# Patient Record
Sex: Female | Born: 2000 | Race: Black or African American | Hispanic: No | Marital: Single | Smoking: Never smoker
Health system: Southern US, Community
[De-identification: ages and names within clinical notes are randomized; demographics above are authoritative.]

## PROBLEM LIST (undated history)

## (undated) DIAGNOSIS — J302 Other seasonal allergic rhinitis: Secondary | ICD-10-CM

## (undated) DIAGNOSIS — R7303 Prediabetes: Secondary | ICD-10-CM

## (undated) DIAGNOSIS — E119 Type 2 diabetes mellitus without complications: Secondary | ICD-10-CM

## (undated) DIAGNOSIS — F419 Anxiety disorder, unspecified: Secondary | ICD-10-CM

## (undated) DIAGNOSIS — H4710 Unspecified papilledema: Secondary | ICD-10-CM

## (undated) DIAGNOSIS — J45909 Unspecified asthma, uncomplicated: Secondary | ICD-10-CM

## (undated) DIAGNOSIS — F329 Major depressive disorder, single episode, unspecified: Secondary | ICD-10-CM

## (undated) DIAGNOSIS — L309 Dermatitis, unspecified: Secondary | ICD-10-CM

## (undated) DIAGNOSIS — L600 Ingrowing nail: Secondary | ICD-10-CM

## (undated) DIAGNOSIS — F32A Depression, unspecified: Secondary | ICD-10-CM

## (undated) HISTORY — DX: Anxiety disorder, unspecified: F41.9

## (undated) HISTORY — DX: Prediabetes: R73.03

## (undated) HISTORY — PX: WISDOM TOOTH EXTRACTION: SHX21

## (undated) HISTORY — DX: Unspecified papilledema: H47.10

---
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
## 2019-02-07 IMAGING — XA DG FLUORO GUIDE LUMBAR PUNCTURE
2 series · 2 of 2 positions shown · non-contrast
Comparison: none

CLINICAL DATA: Idiopathic intracranial hypertension.

[Series 3: ortho adipose · 1 of 1 slices shown (1 of 2)]
[im 1/1]
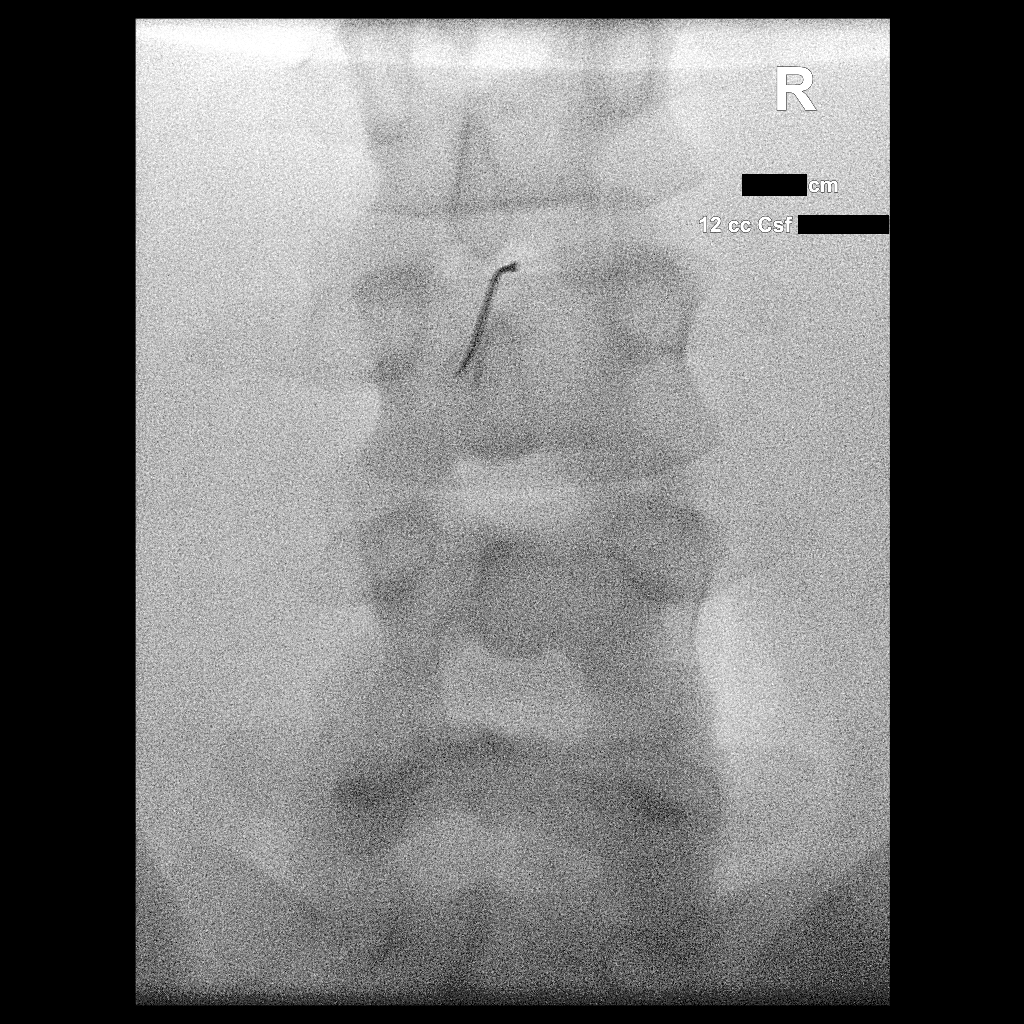

[Series 4: ortho adipose · 1 of 1 slices shown (2 of 2)]
[im 1/1]
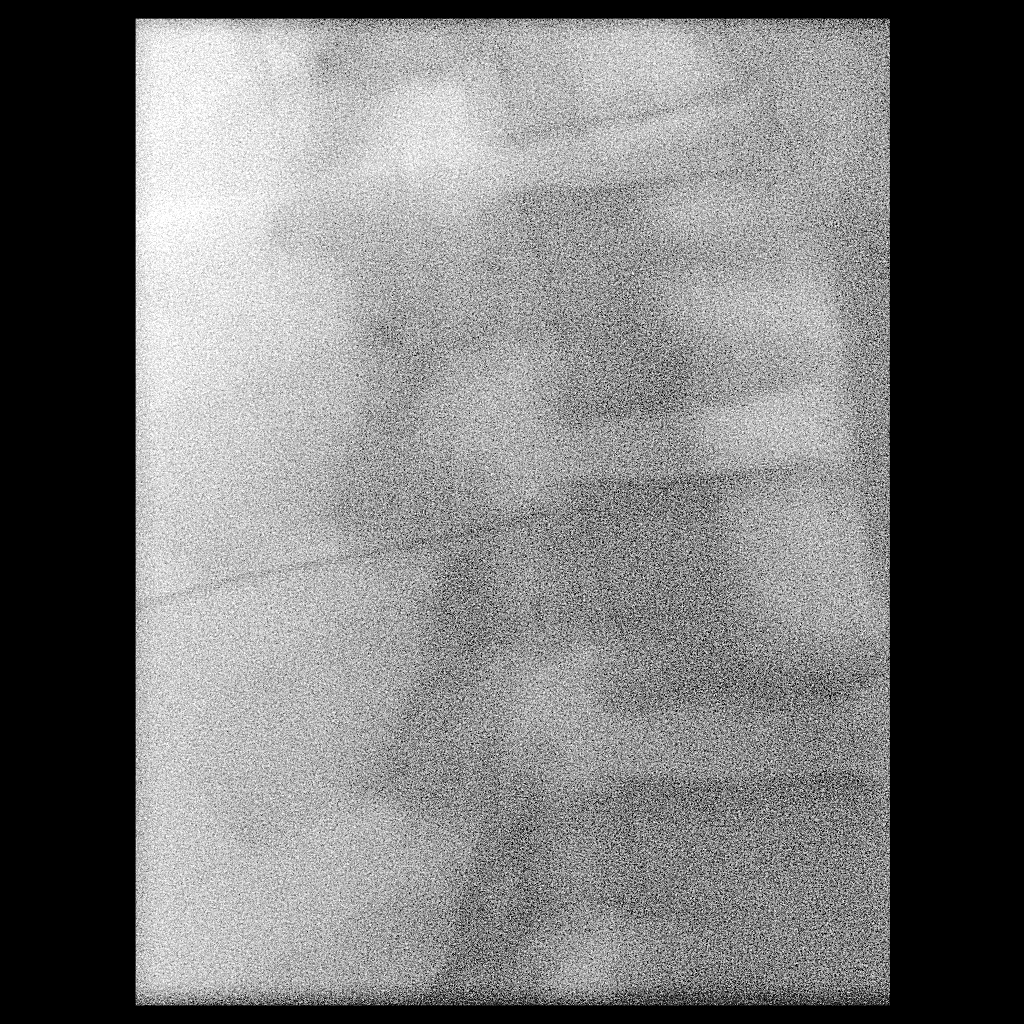

[2 of 2 positions shown; findings below may reference images not displayed]

EXAM:
DIAGNOSTIC LUMBAR PUNCTURE UNDER FLUOROSCOPIC GUIDANCE

FLUOROSCOPY TIME:  Fluoroscopy Time:  1 minutes 49 seconds.

Radiation Exposure Index (if provided by the fluoroscopic device):
990.58 uGy*m2

Number of Acquired Spot Images: 0

PROCEDURE:
Informed consent was obtained from the patient prior to the
procedure, including potential complications of headache, allergy,
and pain. With the patient prone, the lower back was prepped with
Betadine. 1% Lidocaine was used for local anesthesia. Lumbar
puncture was performed at the right paramedian L3-4 level using a 6
inch 20 gauge needle with return of clear CSF with an opening
pressure of 30 cm water. Of note, the opening pressure was obtained
with the patient in the prone position. 15 cm water was measured in
the manometer. The needle was an additional 15 cm in length. I was
unable to access the subarachnoid space in the left lateral
decubitus position. The patient was unable to roll into the left
lateral decubitus position after needle placement. She was extremely
anxious throughout the procedure. 12.0 ml of CSF were obtained for
laboratory studies. Apart from extreme anxiety, the patient
tolerated the procedure well and there were no apparent
complications.
IMPRESSION: 1. Technically successful fluoroscopic guided lumbar puncture via a
right paramedian approach at the L3-4 level.
2. Elevated opening pressure compatible with the presumed diagnosis
of idiopathic intracranial hypertension. The opening pressure was 30
cm water, acquired in the prone position.
3. 12.0 mL clear CSF was sent for laboratory testing.

## 2019-02-20 ENCOUNTER — Telehealth: Payer: Self-pay | Admitting: *Deleted

## 2019-02-20 ENCOUNTER — Ambulatory Visit: Payer: Medicaid Other | Admitting: Neurology

## 2019-02-20 ENCOUNTER — Encounter: Payer: Self-pay | Admitting: Neurology

## 2019-02-20 NOTE — Telephone Encounter (Signed)
No showed follow up appointment. 

## 2019-04-17 ENCOUNTER — Ambulatory Visit: Payer: Medicaid Other | Admitting: Neurology

## 2019-05-29 ENCOUNTER — Ambulatory Visit: Payer: Medicaid Other | Admitting: Neurology

## 2019-07-09 ENCOUNTER — Telehealth: Payer: Self-pay | Admitting: *Deleted

## 2019-07-09 ENCOUNTER — Ambulatory Visit: Payer: Medicaid Other | Admitting: Neurology

## 2019-07-09 ENCOUNTER — Encounter: Payer: Self-pay | Admitting: Neurology

## 2019-07-09 NOTE — Telephone Encounter (Signed)
Canceled follow up same day.  No reason was obtained.

## 2020-01-31 ENCOUNTER — Encounter (INDEPENDENT_AMBULATORY_CARE_PROVIDER_SITE_OTHER): Payer: Self-pay | Admitting: Family

## 2020-01-31 ENCOUNTER — Other Ambulatory Visit: Payer: Self-pay

## 2020-01-31 ENCOUNTER — Ambulatory Visit (INDEPENDENT_AMBULATORY_CARE_PROVIDER_SITE_OTHER): Payer: Medicaid Other | Admitting: Family

## 2020-01-31 VITALS — BP 128/68 | HR 78 | Wt 380.0 lb

## 2020-01-31 DIAGNOSIS — R7303 Prediabetes: Secondary | ICD-10-CM

## 2020-01-31 DIAGNOSIS — L680 Hirsutism: Secondary | ICD-10-CM | POA: Diagnosis not present

## 2020-01-31 DIAGNOSIS — L83 Acanthosis nigricans: Secondary | ICD-10-CM | POA: Diagnosis not present

## 2020-01-31 DIAGNOSIS — Z68.41 Body mass index (BMI) pediatric, greater than or equal to 95th percentile for age: Secondary | ICD-10-CM

## 2020-01-31 LAB — POCT GLYCOSYLATED HEMOGLOBIN (HGB A1C): Hemoglobin A1C: 5.8 % — AB (ref 4.0–5.6)

## 2020-01-31 LAB — POCT GLUCOSE (DEVICE FOR HOME USE): POC Glucose: 95 mg/dl (ref 70–99)

## 2020-01-31 MED ORDER — METFORMIN HCL 500 MG PO TABS: 500.0000 mg | ORAL_TABLET | Freq: Two times a day (BID) | ORAL | 5 refills | Status: DC

## 2020-01-31 NOTE — Progress Notes (Signed)
Subjective:  Subjective  Patient Name: Pamela Bowman Date of Birth: 06-18-2001  MRN: [id number]  Pamela Bowman  presents to the office today for follow up evaluation and management of her morbid obesity and prediabetes  HISTORY OF PRESENT ILLNESS:   Pamela Bowman is a 19 y.o. AA [employer]   Pamela Bowman was accompanied by her self   1. Pamela Bowman was seen by her PCP in August 2016 for her 14 year WCC. At that visit they discussed rapid weight gain and elevated acanthosis and elevated a1c. She was started on Metformin and lifestyle changes with more frequent follow up. She had some initial weight loss but then regained her weight. She was referred to endocrinology for further evaluation and management.    2. Pamela Bowman's last clinic visit was 08/2017. Pamela Bowman has been generally healthy.   She is currently working at a [employer] and plans to go back to [employer].   She has started being seen by neurology for headaches. She had a spinal tap and CT but reports that everything was normal.   She is taking 500 mg of Metformin BID, he PCP has been refilling it. She reports some upset stomach with medication.   She got a treadmill but has not been exercising very often. She is hoping to start doing 5 minutes about 3 x per week.   She has cut back on sugar drinks, likes sparkling water now. Rarely going out to eat. Gets second servings at meals but is trying to cut back.    3. Pertinent Review of Systems:  All systems reviewed with pertinent positives listed below; otherwise negative. Constitutional: 45 lbs weight gain since last visit.  Sleeping well HEENT: No vision changes. No difficulty swallowing.  Respiratory: No increased work of breathing currently GI: No constipation or diarrhea Musculoskeletal: No joint deformity Neuro: Normal affect. No tremors. + headaches, followed by neurology.  Endocrine: As above   PAST MEDICAL, FAMILY, AND SOCIAL HISTORY  Past Medical History:  Diagnosis Date  . Asthma   . Depression    . Diabetes mellitus without complication (HCC)    says it is pre-diabetes  . Eczema   . Ingrown toenail   . Optic nerve edema   . Seasonal allergies     Family History  Problem Relation Age of Onset  . Diabetes Other   . Hypertension Other   . Breast cancer Maternal Grandmother   . Obesity Maternal Grandmother   . Cancer Paternal Grandfather        unsure of type  . Asthma Paternal Aunt   . Obesity Paternal Grandmother      Current Outpatient Medications:  .  albuterol (PROVENTIL HFA;VENTOLIN HFA) 108 (90 Base) MCG/ACT inhaler, Inhale 1-2 puffs into the lungs every 6 (six) hours as needed for wheezing or shortness of breath., Disp: 1 Inhaler, Rfl: 0 .  buPROPion (WELLBUTRIN SR) 200 MG 12 hr tablet, Take 200 mg by mouth daily., Disp: , Rfl: 0 .  cyclobenzaprine (FLEXERIL) 5 MG tablet, Take 1 tablet (5 mg total) by mouth 3 (three) times daily as needed for muscle spasms., Disp: 60 tablet, Rfl: 1 .  metFORMIN (GLUCOPHAGE) 500 MG tablet, Take 1 tablet (500 mg total) by mouth 2 (two) times daily with a meal., Disp: 60 tablet, Rfl: 5 .  naproxen (NAPROSYN) 500 MG tablet, Take 1 tablet (500 mg total) by mouth 2 (two) times daily as needed., Disp: 60 tablet, Rfl: 0 .  nortriptyline (PAMELOR) 25 MG capsule, Take 2 capsules (50 mg  total) by mouth at bedtime., Disp: 60 capsule, Rfl: 11 .  ondansetron (ZOFRAN ODT) 4 MG disintegrating tablet, Take 1 tablet (4 mg total) by mouth every 8 (eight) hours as needed., Disp: 20 tablet, Rfl: 11 .  prazosin (MINIPRESS) 1 MG capsule, TAKE 1 CAPSULE BY MOUTH EVERYDAY AT BEDTIME, Disp: , Rfl: 3 .  rizatriptan (MAXALT-MLT) 10 MG disintegrating tablet, Take 1 tablet (10 mg total) by mouth as needed. May repeat in 2 hours if needed, no more than 2 tabs in 24 hours  Do not refill in less than 30 days,, Disp: 12 tablet, Rfl: 11 .  Spacer/Aero-Holding Chambers (AEROCHAMBER PLUS) inhaler, Use as instructed, Disp: 1 each, Rfl: 2 .  topiramate (TOPAMAX) 100 MG  tablet, Take 2 tablets (200 mg total) by mouth at bedtime., Disp: 90 tablet, Rfl: 4 .  triamcinolone cream (KENALOG) 0.1 %, Apply 1 application topically 2 (two) times daily., Disp: 30 g, Rfl: 0 .  cetirizine (ZYRTEC) 10 MG tablet, Take 10 mg by mouth daily. , Disp: , Rfl:  .  FLOVENT HFA 110 MCG/ACT inhaler, INHALE 2 PUFFS (220 MCG) BY INHALATION ROUTE 2 TIMES PER DAY FOR 30 DAYS (Patient not taking: Reported on 01/31/2020), Disp: , Rfl: 11  Allergies as of 01/31/2020  . (No Known Allergies)     reports that she has never smoked. She has never used smokeless tobacco. She reports that she does not drink alcohol and does not use drugs. Pediatric History  Patient Parents  . Flax,Damien (Father)   Other Topics Concern  . Not on file  Social History Narrative   12th grade at [employer].   Right-handed.   Lives at home with father and grandmother.   Some day use of caffeine.   1. [employer] and Family: lives with aunt and grandmother. Brothers live with mom.  2. Activities: not active.  Walking with family. Climbing stairs at [employer].  3. Primary Care Provider: [facility]  ROS: There are no other significant problems involving Pamela Bowman other body systems.    Objective:  Objective  Vital Signs:  BP 128/68   Pulse 78   Wt (!) 380 lb (172.4 kg)   LMP 01/01/2020   BMI 71.80 kg/m   Growth percentile SmartLinks can only be used for patients less than 8 years old.  Ht Readings from Last 3 Encounters:  03/02/18 5\' 1"  (1.549 m) (10 %, Z= -1.27)*  03/01/18 5\' 1"  (1.549 m) (10 %, Z= -1.27)*  11/03/17 5\' 1"  (1.549 m) (10 %, Z= -1.26)*   * Growth percentiles are based on CDC (Girls, 2-20 Years) data.   Wt Readings from Last 3 Encounters:  01/31/20 (!) 380 lb (172.4 kg)  03/02/18 (!) 335 lb (152 kg) (>99 %, Z= 2.86)*  03/01/18 (!) 329 lb (149.2 kg) (>99 %, Z= 2.84)*   * Growth percentiles are based on CDC (Girls, 2-20 Years) data.   HC Readings from  Last 3 Encounters:  No data found for Pamela Bowman   Body surface area is 2.72 meters squared. Facility age limit for growth percentiles is 20 years. Facility age limit for growth percentiles is 20 years.    PHYSICAL EXAM:  General: Obese [place] in no acute distress.  Head: Normocephalic, atraumatic.   Eyes:  Pupils equal and round. EOMI.   Sclera white.  No eye drainage.   Ears/Nose/Mouth/Throat: Nares patent, no nasal drainage.  Normal dentition, mucous membranes moist.   Neck: supple, no cervical lymphadenopathy, no  thyromegaly Cardiovascular: regular rate, normal S1/S2, no murmurs Respiratory: No increased work of breathing.  Lungs clear to auscultation bilaterally.  No wheezes. Abdomen: soft, nontender, nondistended. Normal bowel sounds.  No appreciable masses  Extremities: warm, well perfused, cap refill < 2 sec.   Musculoskeletal: Normal muscle mass.  Normal strength Skin: warm, dry.  No rash or lesions. + acanthosis nigricans.  Neurologic: alert and oriented, normal speech, no tremor   LAB DATA:   Results for orders placed or performed in visit on 01/31/20  POCT Glucose (Device for Home Use)  Result Value Ref Range   Glucose Fasting, POC     POC Glucose 95 70 - 99 mg/dl  POCT glycosylated hemoglobin (Hb A1C)  Result Value Ref Range   Hemoglobin A1C 5.8 (A) 4.0 - 5.6 %   HbA1c POC (<> result, manual entry)     HbA1c, POC (prediabetic range)     HbA1c, POC (controlled diabetic range)          LDL 149 in November 2016  Assessment and Plan:  Assessment  ASSESSMENT:  Pamela Bowman is a 19 y.o. AA [employer] with prediabetes and morbid pediatric obesity. Pamela [place] has struggled with lifestyle changes. She has gained 45 lbs, BMI is >99%ile. Hemoglobin A1c is prediabetes range at 5.8% on 500 mg of Metformin BID. She is going to transition to adult endocrinology care.    1. Mobid obesity/prediabetes/acanthosis -POCT Glucose (CBG) and POCT HgB A1C obtained today -Growth chart reviewed with  family -Discussed pathophysiology of T2DM and explained hemoglobin A1c levels -Discussed eliminating sugary beverages, changing to occasional diet sodas, and increasing water intake -Encouraged to eat most meals at home -Encouraged to increase physical activity - 500 mg of Metformin BID   - Refills ordered   2. Hirsutism  - Discussed options for decreasing hair  - 1. OCP  - 2. Spironolactone --> will make hair thinner and lighter but takes 2-3 months to work  - She prefers to wait at this time.   3. Hyperlipidemia  - Discussed importance of dietary changes and exercise.  - Fasting lipid panel ordered   Follow up : NO follow up. She will be referred to Adult endocrinology.    >30  spent today reviewing the medical chart, counseling the patient/family, and documenting today's visit.    Gretchen Short,  FNP-C  Pediatric Specialist  [address]  [place], [address]  Tele: [phone]

## 2020-01-31 NOTE — Patient Instructions (Signed)
-  Eliminate sugary drinks (regular soda, juice, sweet tea, regular gatorade) from your diet -Drink water or milk (preferably 1% or skim) -Avoid fried foods and junk food (chips, cookies, candy) -Watch portion sizes -Pack your lunch for school -Try to get 30 minutes of activity daily  - Will refer to adult endocrinology to establish care  - Metformin 500 mg twice per day

## 2020-02-19 ENCOUNTER — Ambulatory Visit: Payer: Medicaid Other | Admitting: Neurology

## 2020-03-03 ENCOUNTER — Telehealth (INDEPENDENT_AMBULATORY_CARE_PROVIDER_SITE_OTHER): Payer: Self-pay | Admitting: Family

## 2020-03-03 NOTE — Telephone Encounter (Signed)
That is fine 

## 2020-03-03 NOTE — Telephone Encounter (Signed)
Called patient back, grandma put diabetes on the original DMV forms while patient was in hospital.  Pamela [place] filled out the [facility] forms at her last appointment.  She mailed the forms in and they have not gotten them yet.  She said they need a letter on letter head with her A1C and that she is compliant with her treatment.  (similar to what is requested on page 4 of the DMV form but on letterhead)

## 2020-03-03 NOTE — Telephone Encounter (Signed)
  Who's calling (name and relationship to patient) : Ghina, Bittinger Best contact number: [phone] Provider they see: Ovidio Kin Reason for call: [place] is having a hard time getting her DMV forms submitted with all the information that is needed in the proper format.  Please call.     PRESCRIPTION REFILL ONLY  Name of prescription:  Pharmacy:

## 2020-03-03 NOTE — Telephone Encounter (Signed)
Returned phone call to patient.  She called DMV this am to follow up on the paper that [place] completed.  They need a "letter with her A1C and compliance."  She said that all this is on the paper but they also need a letter.  They need it faxed to [phone]

## 2020-03-04 ENCOUNTER — Encounter (INDEPENDENT_AMBULATORY_CARE_PROVIDER_SITE_OTHER): Payer: Self-pay

## 2020-03-06 NOTE — Telephone Encounter (Signed)
[employer] called to check on this request

## 2020-03-06 NOTE — Telephone Encounter (Signed)
Called to let her know letter was written and faxed.  She would like a copy mailed to her.  She mentioned she will call DMV to see if they received the letter.

## 2020-03-24 ENCOUNTER — Ambulatory Visit: Payer: Medicaid Other | Admitting: Neurology

## 2020-05-06 ENCOUNTER — Ambulatory Visit: Payer: Self-pay | Admitting: Endocrinology

## 2020-05-07 ENCOUNTER — Ambulatory Visit: Payer: Medicaid Other | Admitting: Neurology

## 2020-06-06 ENCOUNTER — Ambulatory Visit: Payer: Self-pay | Admitting: Endocrinology

## 2020-06-25 ENCOUNTER — Telehealth: Payer: Self-pay | Admitting: Neurology

## 2020-06-25 ENCOUNTER — Other Ambulatory Visit: Payer: Self-pay

## 2020-06-25 ENCOUNTER — Ambulatory Visit: Payer: Medicaid Other | Admitting: Neurology

## 2020-06-25 ENCOUNTER — Encounter: Payer: Self-pay | Admitting: Neurology

## 2020-06-25 VITALS — BP 134/77 | HR 94 | Ht 61.0 in | Wt 383.0 lb

## 2020-06-25 DIAGNOSIS — G932 Benign intracranial hypertension: Secondary | ICD-10-CM | POA: Diagnosis not present

## 2020-06-25 DIAGNOSIS — G43709 Chronic migraine without aura, not intractable, without status migrainosus: Secondary | ICD-10-CM

## 2020-06-25 HISTORY — DX: Chronic migraine without aura, not intractable, without status migrainosus: G43.709

## 2020-06-25 MED ORDER — ALBUTEROL SULFATE HFA 108 (90 BASE) MCG/ACT IN AERS: 1.0000 | INHALATION_SPRAY | Freq: Four times a day (QID) | RESPIRATORY_TRACT | 6 refills | Status: DC | PRN

## 2020-06-25 MED ORDER — TOPIRAMATE 100 MG PO TABS: 200.0000 mg | ORAL_TABLET | Freq: Every day | ORAL | 4 refills | Status: DC

## 2020-06-25 MED ORDER — RIZATRIPTAN BENZOATE 10 MG PO TBDP
10.0000 mg | ORAL_TABLET | ORAL | 11 refills | Status: DC | PRN
Start: 2020-06-25 — End: 2021-06-24

## 2020-06-25 NOTE — Progress Notes (Signed)
Chief Complaint  Patient presents with  . Pseudotumor Cerebri    Reports improvement in headaches since being on Topamax 100mg , 2 tabs QHS. She has some tingling around her mouth but it is tolerable. Maxalt works well for her rescue medication.     HISTORICAL Arienna Benegas 19 years old female, seen in refer by her primary care physician Dr. 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 Initial evaluation was on Aug 29 2017.  She is accompanied by her grandmother  During her most recent yearly checkup by Dr. 08-15-1972 on August 20, 2017, there was noted bilateral disc margin is indistinct, this is crowded, she denies significant headaches, no loss of vision, but had transient blurry vision occasionally.  She is referred here for possible pseudotumor cerebri, she does have a history of obesity, but denies significant weight change over the past few weeks.  She noted left upper and lower eyelid swelling since August 26, 2017 gradually getting worse, to the point of walking left vision sometimes,  She was given 2 rounds of double strength Bactrim since August 04, 2017 for cellulitis   Lab: A1C 5.5, BMP, CBC,  MRI of the orbit without contrast showed no significant abnormality in March 2019 Left eyelid cellulitis is much improved with antibiotic treatment, She has 2-3 headaches each week, lateralized, pounding headache with associated light noise sensitivity, she has been taking frequent BC powders, she has been absent from school for many days, complains of blurred vision, especially with sudden positional change.  Lumbar puncture on September 29, 2017 showed opening pressure of 30 cm water,  Normal CSF, WBC of 4, RBC of 0, protein of 32,  Her headache overall has  much improved, She is tolerating Topamax 100 mg twice a day, complains of numbness, tingly,  loss of appetite, now she has headache 2-3 times each week, Maxalt 5 mg as needed initially works well, now is  not as effective  Reviewed feedback from ophthalmologist Dr. 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 10-30-1993 recently, most recent visit in July 2021, there was no significant abnormality noted  She denies visual change, migraine has improved, about twice a week, responding well to Maxalt,   REVIEW OF SYSTEMS: Full 14 system review of systems performed and notable only for as above All other review of systems were negative.  ALLERGIES: No Known Allergies  HOME MEDICATIONS: Current Outpatient Medications  Medication Sig Dispense Refill  . albuterol (PROVENTIL HFA;VENTOLIN HFA) 108 (90 Base) MCG/ACT inhaler Inhale 1-2 puffs into the lungs every 6 (six) hours as needed for wheezing or shortness of breath. 1 Inhaler 0  . buPROPion (WELLBUTRIN SR) 200 MG 12 hr tablet Take 200 mg by mouth daily.  0  . cetirizine (ZYRTEC) 10 MG tablet Take 10 mg by mouth daily.    . cyclobenzaprine (FLEXERIL) 5 MG tablet Take 1 tablet (5 mg total) by mouth 3 (three) times daily as needed for muscle spasms. 60 tablet 1  . FLOVENT HFA 110 MCG/ACT inhaler INHALE 2 PUFFS (220 MCG) BY INHALATION ROUTE 2 TIMES PER DAY FOR 30 DAYS  11  . metFORMIN (GLUCOPHAGE) 500 MG tablet Take 1 tablet (  500 mg total) by mouth 2 (two) times daily with a meal. 60 tablet 5  . naproxen (NAPROSYN) 500 MG tablet Take 1 tablet (500 mg total) by mouth 2 (two) times daily as needed. 60 tablet 0  . nortriptyline (PAMELOR) 25 MG capsule Take 2 capsules (50 mg total) by mouth at bedtime. 60 capsule 11  . ondansetron (ZOFRAN ODT) 4 MG disintegrating tablet Take 1 tablet (4 mg total) by mouth  every 8 (eight) hours as needed. 20 tablet 11  . prazosin (MINIPRESS) 1 MG capsule TAKE 1 CAPSULE BY MOUTH EVERYDAY AT BEDTIME  3  . rizatriptan (MAXALT-MLT) 10 MG disintegrating tablet Take 1 tablet (10 mg total) by mouth as needed. May repeat in 2 hours if needed, no more than 2 tabs in 24 hours  Do not refill in less than 30 days, 12 tablet 11  . Spacer/Aero-Holding Chambers (AEROCHAMBER PLUS) inhaler Use as instructed 1 each 2  . topiramate (TOPAMAX) 100 MG tablet Take 2 tablets (200 mg total) by mouth at bedtime. 90 tablet 4  . triamcinolone cream (KENALOG) 0.1 % Apply 1 application topically 2 (two) times daily. 30 g 0   No current facility-administered medications for this visit.    PAST MEDICAL HISTORY: Past Medical History:  Diagnosis Date  . Asthma   . Depression   . Diabetes mellitus without complication (HCC)    says it is pre-diabetes  . Eczema   . Ingrown toenail   . Optic nerve edema   . Seasonal allergies     PAST SURGICAL HISTORY: Past Surgical History:  Procedure Laterality Date  . TONSILLECTOMY AND ADENOIDECTOMY Bilateral 10/15/2015   Procedure: TONSILLECTOMY AND ADENOIDECTOMY;  Surgeon: Newman Pies, MD;  Location: [facility] OR;  Service: ENT;  Laterality: Bilateral;  . WISDOM TOOTH EXTRACTION      FAMILY HISTORY: Family History  Problem Relation Age of Onset  . Diabetes Other   . Hypertension Other   . Breast cancer Maternal Grandmother   . Obesity Maternal Grandmother   . Cancer Paternal Grandfather        unsure of type  . Asthma Paternal Aunt   . Obesity Paternal Grandmother     SOCIAL HISTORY: Social History   Socioeconomic History  . Marital status: Single    Spouse name: Not on file  . Number of children: 0  . Years of education: high school  . Highest education level: Not on file  Occupational History  . Occupation: Consulting civil engineer  Tobacco Use  . Smoking status: Never Smoker  . Smokeless tobacco: Never Used  Vaping Use  . Vaping Use: Never used   Substance and Sexual Activity  . Alcohol use: No  . Drug use: No  . Sexual activity: Not on file  Other Topics Concern  . Not on file  Social History Narrative   12th grade at [employer].   Right-handed.   Lives at home with father and grandmother.   Some day use of caffeine.   Social Determinants of Health   Financial Resource Strain: Not on file  Food Insecurity: Not on file  Transportation Needs: Not on file  Physical Activity: Not on file  Stress: Not on file  Social Connections: Not on file  Intimate Partner Violence: Not on file     PHYSICAL EXAM   Vitals:   06/25/20 0730  BP: 134/77  Pulse: 94  Weight: (!) 383 lb (173.7 kg)  Height: 5\' 1"  (1.549 m)   Not recorded  Body mass index is 72.37 kg/m.  PHYSICAL EXAMNIATION:  Gen: NAD, conversant, well nourised, well groomed                     Cardiovascular: Regular rate rhythm, no peripheral edema, warm, nontender. Eyes: Conjunctivae clear without exudates or hemorrhage Neck: Supple, no carotid bruits. Pulmonary: Clear to auscultation bilaterally   NEUROLOGICAL EXAM:  MENTAL STATUS: Speech/cognition: Awake alert oriented to history taking care of conversation   CRANIAL NERVES: CN II: Visual fields are full to confrontation. Pupils are round equal and briskly reactive to light. CN III, IV, VI: extraocular movement are normal. No ptosis. CN V: Facial sensation is intact to light touch CN VII: Face is symmetric with normal eye closure  CN VIII: Hearing is normal to causal conversation. CN IX, X: Phonation is normal. CN XI: Head turning and shoulder shrug are intact  MOTOR: There is no pronator drift of out-stretched arms. Muscle bulk and tone are normal. Muscle strength is normal.  REFLEXES: Reflexes are 2+ and symmetric at the biceps, triceps, knees, and ankles. Plantar responses are flexor.  SENSORY: Intact to light touch, pinprick and vibratory sensation are intact in fingers and  toes.  COORDINATION: There is no trunk or limb dysmetria noted.  GAIT/STANCE: Limited by her big body habitus   DIAGNOSTIC DATA (LABS, IMAGING, TESTING) - I reviewed patient records, labs, notes, testing and imaging myself where available.   ASSESSMENT AND PLAN  Soniya Ashraf is a 19 y.o. female   Morbid obesity Pseudotumor Cerebri Chronic migraine headache  Refill Topamax 100 mg 2 tablets every night  Emphasized importance of weight loss, continue to work with her endocrinologist  Maxalt works well, refilled the prescription  Continue follow-up with ophthalmologist Dr. Dione Booze, will get most recent record, if there is persistent optic disc edema, may consider repeat lumbar puncture,  Return to clinic with Sarah in 6 months  Levert Feinstein, M.D. Ph.D.  [facility] [address], [address] [place], [place] [address] Ph: [phone] Fax: [phone]  CC:  [facility] [address] [place],  [place] [address]  Patient, No Pcp Per

## 2020-06-25 NOTE — Telephone Encounter (Signed)
Please get medical record from her most recent visit with ophthalmologist Dr. Dione Booze.

## 2020-07-10 ENCOUNTER — Ambulatory Visit: Payer: Self-pay | Admitting: Endocrinology

## 2020-08-18 ENCOUNTER — Ambulatory Visit: Payer: Self-pay | Admitting: Endocrinology

## 2020-08-19 ENCOUNTER — Other Ambulatory Visit (INDEPENDENT_AMBULATORY_CARE_PROVIDER_SITE_OTHER): Payer: Self-pay | Admitting: Family

## 2020-08-25 ENCOUNTER — Ambulatory Visit: Payer: Medicaid Other | Admitting: Family Medicine

## 2020-09-10 ENCOUNTER — Ambulatory Visit: Payer: Medicaid Other | Admitting: Family Medicine

## 2020-09-25 ENCOUNTER — Ambulatory Visit: Payer: Self-pay | Admitting: Endocrinology

## 2020-12-04 ENCOUNTER — Other Ambulatory Visit (INDEPENDENT_AMBULATORY_CARE_PROVIDER_SITE_OTHER): Payer: Self-pay | Admitting: Family

## 2020-12-24 ENCOUNTER — Ambulatory Visit: Payer: Medicaid Other | Admitting: Neurology

## 2020-12-29 ENCOUNTER — Other Ambulatory Visit: Payer: Self-pay | Admitting: Neurology

## 2021-06-23 NOTE — Progress Notes (Signed)
PATIENT: Pamela Bowman DOB: [birth year]  REASON FOR VISIT: Follow up HISTORY FROM: Patient PRIMARY NEUROLOGIST: Dr. Terrace Arabia   HISTORY  Pamela Bowman 20 years old female, seen in refer by her primary care physician Dr. 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 Initial evaluation was on Aug 29 2017.  She is accompanied by her grandmother   During her most recent yearly checkup by Dr. Donald Prose on August 20, 2017, there was noted bilateral disc margin is indistinct, this is crowded, she denies significant headaches, no loss of vision, but had transient blurry vision occasionally.  She is referred here for possible pseudotumor cerebri, she does have a history of obesity, but denies significant weight change over the past few weeks.   She noted left upper and lower eyelid swelling since August 26, 2017 gradually getting worse, to the point of walking left vision sometimes,   She was given 2 rounds of double strength Bactrim since August 04, 2017 for cellulitis    Lab: A1C 5.5, BMP, CBC,   MRI of the orbit without contrast showed no significant abnormality in March 2019 Left eyelid cellulitis is much improved with antibiotic treatment, She has 2-3 headaches each week, lateralized, pounding headache with associated light noise sensitivity, she has been taking frequent BC powders, she has been absent from school for many days, complains of blurred vision, especially with sudden positional change.   Lumbar puncture on September 29, 2017 showed opening pressure of 30 cm water,   Normal CSF, WBC of 4, RBC of 0, protein of 32,   Her headache overall has  much improved, She is tolerating Topamax 100 mg twice a day, complains of numbness, tingly,  loss of appetite, now she has headache 2-3 times each week, Maxalt 5 mg as needed initially works well, now is not as effective   Reviewed feedback from ophthalmologist Dr. 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 Dione Booze recently, most recent visit in July 2021, there was no significant abnormality noted   She denies visual change, migraine has improved, about twice a week, responding well to Maxalt  Update June 24, 2021 SS: Here today alone, has gained 5 lbs, now weighs 187 lbs. She thought she lost weight, going up steps, cooking at home, stopping drink soda. Still on topamax 200 mg at bedtime. Seeing Dr. Dione Booze in March. Saw him twice last year, end of year in 2021 December. Has drivers license now. Migraines are essentially resolved, maybe 1/month, will take extra strength Tylenol, it helps. Maxalt makes her feel tingly, anxious. No vision disturbances, needs to get new frames. Needs new PCP.   REVIEW OF SYSTEMS: Out of a complete 14 system review of symptoms, the patient complains only of the following symptoms, and all other reviewed systems are negative.   See HPI  ALLERGIES: No Known Allergies  HOME MEDICATIONS: Outpatient Medications Prior to Visit  Medication Sig Dispense Refill   buPROPion (WELLBUTRIN SR) 200 MG 12 hr tablet Take 200 mg by mouth daily.  0   cetirizine (ZYRTEC) 10 MG tablet Take 10 mg by mouth daily.     cyclobenzaprine (FLEXERIL) 5 MG tablet Take 1  tablet (5 mg total) by mouth 3 (three) times daily as needed for muscle spasms. 60 tablet 1   FLOVENT HFA 110 MCG/ACT inhaler INHALE 2 PUFFS (220 MCG) BY INHALATION ROUTE 2 TIMES PER DAY FOR 30 DAYS  11   metFORMIN (GLUCOPHAGE) 500 MG tablet Take 1 tablet (500 mg total) by mouth 2 (two) times daily with a meal. 60 tablet 5   naproxen (NAPROSYN) 500 MG tablet Take 1 tablet (500 mg total) by  mouth 2 (two) times daily as needed. 60 tablet 0   ondansetron (ZOFRAN ODT) 4 MG disintegrating tablet Take 1 tablet (4 mg total) by mouth every 8 (eight) hours as needed. 20 tablet 11   prazosin (MINIPRESS) 1 MG capsule TAKE 1 CAPSULE BY MOUTH EVERYDAY AT BEDTIME  3   Spacer/Aero-Holding Chambers (AEROCHAMBER PLUS) inhaler Use as instructed 1 each 2   triamcinolone cream (KENALOG) 0.1 % Apply 1 application topically 2 (two) times daily. 30 g 0   albuterol (VENTOLIN HFA) 108 (90 Base) MCG/ACT inhaler Inhale 1-2 puffs into the lungs every 6 (six) hours as needed for wheezing or shortness of breath. 1 each 6   nortriptyline (PAMELOR) 25 MG capsule Take 2 capsules (50 mg total) by mouth at bedtime. 60 capsule 11   rizatriptan (MAXALT-MLT) 10 MG disintegrating tablet Take 1 tablet (10 mg total) by mouth as needed. May repeat in 2 hours if needed, no more than 2 tabs in 24 hours  Do not refill in less than 30 days, 12 tablet 11   topiramate (TOPAMAX) 100 MG tablet Take 2 tablets (200 mg total) by mouth at bedtime. 180 tablet 4   No facility-administered medications prior to visit.    PAST MEDICAL HISTORY: Past Medical History:  Diagnosis Date   Asthma    Depression    Diabetes mellitus without complication (HCC)    says it is pre-diabetes   Eczema    Ingrown toenail    Optic nerve edema    Seasonal allergies     PAST SURGICAL HISTORY: Past Surgical History:  Procedure Laterality Date   TONSILLECTOMY AND ADENOIDECTOMY Bilateral 10/15/2015   Procedure: TONSILLECTOMY AND ADENOIDECTOMY;  Surgeon: Newman Pies, MD;  Location: [facility] OR;  Service: ENT;  Laterality: Bilateral;   WISDOM TOOTH EXTRACTION      FAMILY HISTORY: Family History  Problem Relation Age of Onset   Diabetes Other    Hypertension Other    Breast cancer Maternal Grandmother    Obesity Maternal Grandmother    Cancer Paternal Grandfather        unsure of type   Asthma Paternal Aunt    Obesity Paternal Grandmother      SOCIAL HISTORY: Social History   Socioeconomic History   Marital status: Single    Spouse name: Not on file   Number of children: 0   Years of education: high school   Highest education level: Not on file  Occupational History   Occupation: Consulting civil engineer  Tobacco Use   Smoking status: Never   Smokeless tobacco: Never  Vaping Use   Vaping Use: Never used  Substance and Sexual Activity   Alcohol use: No   Drug use: No   Sexual activity: Not on file  Other Topics Concern   Not on file  Social History Narrative   12th grade at [employer].   Right-handed.   Lives at home with father and grandmother.   Some day use of caffeine.   Social Determinants of Health  Financial Resource Strain: Not on file  Food Insecurity: Not on file  Transportation Needs: Not on file  Physical Activity: Not on file  Stress: Not on file  Social Connections: Not on file  Intimate Partner Violence: Not on file   PHYSICAL EXAM  Vitals:   06/24/21 0751  BP: (!) 160/90  Pulse: (!) 101  Weight: (!) 387 lb (175.5 kg)  Height: 5\' 1"  (1.549 m)   Body mass index is 73.12 kg/m.  Generalized: Well developed, in no acute distress  Neurological examination  Mentation: Alert oriented to time, place, history taking. Follows all commands speech and language fluent Cranial nerve II-XII: Pupils were equal round reactive to light. Extraocular movements were full, visual field were full on confrontational test. Facial sensation and strength were normal. Uvula tongue midline. Head turning and shoulder shrug  were normal and symmetric. Motor: The motor testing reveals 5 over 5 strength of all 4 extremities. Good symmetric motor tone is noted throughout.  Sensory: Sensory testing is intact to soft touch on all 4 extremities. No evidence of extinction is noted.  Coordination: Cerebellar testing reveals good finger-nose-finger and heel-to-shin bilaterally.  Gait and station: Gait is normal, but limited by  large body habitus  Reflexes: Deep tendon reflexes are symmetric and normal bilaterally.   DIAGNOSTIC DATA (LABS, IMAGING, TESTING) - I reviewed patient records, labs, notes, testing and imaging myself where available.  Lab Results  Component Value Date   WBC 7.2 08/09/2017   HGB 12.5 08/09/2017   HCT 37.3 08/09/2017   MCV 88.2 08/09/2017   PLT 341 08/09/2017      Component Value Date/Time   NA 139 08/09/2017 0000   K 4.0 08/09/2017 0000   CL 105 08/09/2017 0000   CO2 22 08/09/2017 0000   GLUCOSE 78 08/09/2017 0000   BUN 11 08/09/2017 0000   CREATININE 0.95 08/09/2017 0000   CALCIUM 9.2 08/09/2017 0000   GFRNONAA NOT CALCULATED 10/15/2015 0843   GFRAA NOT CALCULATED 10/15/2015 0843   No results found for: CHOL, HDL, LDLCALC, LDLDIRECT, TRIG, CHOLHDL Lab Results  Component Value Date   HGBA1C 5.8 (A) 01/31/2020   No results found for: VITAMINB12 No results found for: TSH    ASSESSMENT AND PLAN 20 y.o. year old female  has a past medical history of Asthma, Depression, Diabetes mellitus without complication (HCC), Eczema, Ingrown toenail, Optic nerve edema, and Seasonal allergies. here with:  1.  Morbid obesity 2.  Pseudotumor cerebri 3.  Chronic migraine headache  -Symptoms well managed -Continue Topamax 100 mg, 2 tablets at bedtime -Can continue Tylenol for acute headache, on average 1/month -Discussed the importance of weight loss, referral to healthy weight and wellness, BMI is 73 -Referral to PCP, did refill her albuterol since out today -Will get recent office visit with Dr. 36, seeing in March -Call for increase in headache, vision change, otherwise see back in 1 year or sooner if needed   April, DNP 06/24/2021, 8:38 AM [facility] [address] [place], [place] [phone]

## 2021-06-24 ENCOUNTER — Telehealth: Payer: Self-pay | Admitting: Neurology

## 2021-06-24 ENCOUNTER — Encounter: Payer: Self-pay | Admitting: Neurology

## 2021-06-24 ENCOUNTER — Ambulatory Visit: Payer: Medicaid Other | Admitting: Neurology

## 2021-06-24 VITALS — BP 160/90 | HR 101 | Ht 61.0 in | Wt 387.0 lb

## 2021-06-24 DIAGNOSIS — G932 Benign intracranial hypertension: Secondary | ICD-10-CM

## 2021-06-24 DIAGNOSIS — G43709 Chronic migraine without aura, not intractable, without status migrainosus: Secondary | ICD-10-CM | POA: Diagnosis not present

## 2021-06-24 MED ORDER — ALBUTEROL SULFATE HFA 108 (90 BASE) MCG/ACT IN AERS: 1.0000 | INHALATION_SPRAY | Freq: Four times a day (QID) | RESPIRATORY_TRACT | 6 refills | Status: DC | PRN

## 2021-06-24 MED ORDER — TOPIRAMATE 100 MG PO TABS: 200.0000 mg | ORAL_TABLET | Freq: Every day | ORAL | 4 refills | Status: DC

## 2021-06-24 NOTE — Patient Instructions (Signed)
Continue taking Topamax  Referral to Healthy Weight and Wellness, weight loss is key to managing your condition  Referral to primary care doctor  I will get your records from Dr. Dione Booze, make sure keep appointment in March  Call for any increase in headache, vision change See you back in 1 year  I refilled your albuterol until you find a primary care

## 2021-06-24 NOTE — Telephone Encounter (Signed)
Please get most recent office visit from Dr. Dione Booze for IIH. Thanks

## 2021-07-01 NOTE — Telephone Encounter (Signed)
Reviewed most recent office note from 01/21/2020 with Dr. 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 Trace edema of the optic nerve bilaterally, unchanged. Suggested MRI of brain since MRI was done of orbits. I reviewed this with Dr. Terrace Arabia, able to see brain from film, there was no mass.   She was supposed to follow back up in 6 months, but it has now been 1.5 years.  Please remind need to follow-up with Dr. Dione Booze she told me has appointment in March 2023.

## 2021-07-01 NOTE — Telephone Encounter (Signed)
I spoke to the patient. She understands the importance of keeping her scheduled follow up visits. She has a pending appt with Dr. Dione Booze on 09/30/21. She will ask him to send over the clinical summary to [facility] attention.

## 2021-09-17 ENCOUNTER — Ambulatory Visit: Payer: Medicaid Other | Admitting: Nurse Practitioner

## 2021-12-04 ENCOUNTER — Ambulatory Visit: Payer: Medicaid Other | Admitting: Nurse Practitioner

## 2021-12-04 ENCOUNTER — Other Ambulatory Visit: Payer: Self-pay | Admitting: Nurse Practitioner

## 2021-12-04 VITALS — BP 132/84 | HR 106 | Temp 98.4°F | Ht 61.0 in | Wt 384.2 lb

## 2021-12-04 DIAGNOSIS — J4520 Mild intermittent asthma, uncomplicated: Secondary | ICD-10-CM | POA: Diagnosis not present

## 2021-12-04 DIAGNOSIS — F32A Depression, unspecified: Secondary | ICD-10-CM | POA: Diagnosis not present

## 2021-12-04 DIAGNOSIS — Z6841 Body Mass Index (BMI) 40.0 and over, adult: Secondary | ICD-10-CM

## 2021-12-04 DIAGNOSIS — F419 Anxiety disorder, unspecified: Secondary | ICD-10-CM

## 2021-12-04 DIAGNOSIS — L309 Dermatitis, unspecified: Secondary | ICD-10-CM | POA: Diagnosis not present

## 2021-12-04 LAB — COMPREHENSIVE METABOLIC PANEL
ALT: 27 U/L (ref 0–35)
AST: 32 U/L (ref 0–37)
Albumin: 4.4 g/dL (ref 3.5–5.2)
Alkaline Phosphatase: 46 U/L (ref 39–117)
BUN: 9 mg/dL (ref 6–23)
CO2: 27 mEq/L (ref 19–32)
Calcium: 9.5 mg/dL (ref 8.4–10.5)
Chloride: 103 mEq/L (ref 96–112)
Creatinine, Ser: 0.98 mg/dL (ref 0.40–1.20)
GFR: 82.14 mL/min (ref >60.00)
Glucose, Bld: 91 mg/dL (ref 70–99)
Potassium: 4 mEq/L (ref 3.5–5.1)
Sodium: 137 mEq/L (ref 135–145)
Total Bilirubin: 0.7 mg/dL (ref 0.2–1.2)
Total Protein: 7.8 g/dL (ref 6.0–8.3)

## 2021-12-04 LAB — LIPID PANEL
Cholesterol: 200 mg/dL (ref 0–200)
HDL: 36.2 mg/dL — ABNORMAL LOW (ref >39.00)
LDL Cholesterol: 149 mg/dL — ABNORMAL HIGH (ref 0–99)
NonHDL: 163.52
Total CHOL/HDL Ratio: 6
Triglycerides: 74 mg/dL (ref 0.0–149.0)
VLDL: 14.8 mg/dL (ref 0.0–40.0)

## 2021-12-04 LAB — HEMOGLOBIN A1C: Hgb A1c MFr Bld: 5.8 % (ref 4.6–6.5)

## 2021-12-04 LAB — T4, FREE: Free T4: 1.1 ng/dL (ref 0.60–1.60)

## 2021-12-04 LAB — TSH: TSH: 3.41 u[IU]/mL (ref 0.35–5.50)

## 2021-12-04 LAB — T3, FREE: T3, Free: 3.7 pg/mL (ref 2.3–4.2)

## 2021-12-04 LAB — CBC
HCT: 42 % (ref 36.0–46.0)
Hemoglobin: 13.5 g/dL (ref 12.0–15.0)
MCHC: 32.2 g/dL (ref 30.0–36.0)
MCV: 89.7 fl (ref 78.0–100.0)
Platelets: 330 10*3/uL (ref 150.0–400.0)
RBC: 4.68 Mil/uL (ref 3.87–5.11)
RDW: 14 % (ref 11.5–15.5)
WBC: 6.1 10*3/uL (ref 4.0–10.5)

## 2021-12-04 LAB — POCT PREGNANCY, URINE

## 2021-12-04 MED ORDER — TRIAMCINOLONE ACETONIDE 0.1 % EX CREA: 1.0000 "application " | TOPICAL_CREAM | Freq: Two times a day (BID) | CUTANEOUS | 0 refills | Status: DC

## 2021-12-04 MED ORDER — ALBUTEROL SULFATE (2.5 MG/3ML) 0.083% IN NEBU: 2.5000 mg | INHALATION_SOLUTION | Freq: Four times a day (QID) | RESPIRATORY_TRACT | 1 refills | Status: DC | PRN

## 2021-12-04 MED ORDER — SERTRALINE HCL 50 MG PO TABS: 50.0000 mg | ORAL_TABLET | Freq: Every day | ORAL | 3 refills | Status: DC

## 2021-12-04 NOTE — Progress Notes (Signed)
Albuterol neb order

## 2021-12-04 NOTE — Progress Notes (Signed)
Subjective:  Patient ID: Pamela Bowman, female    DOB: [birth year]  Age: 21 y.o. MRN: [id number]  CC:  Chief Complaint  Patient presents with   New Patient (Initial Visit)   Anxiety      HPI  This patient arrives today for the above.  She is here to establish care as a new patient. She has a history of depression and anxiety starting at age 11. She has been evaluated and treated by psychiatry in the past. She reports a history of taking antidepressants but tells me she is not currently on any antidepressants. She reports history of nightmares/screaming at night when she was taking wellbutrin in the past.   She also reports history of asthma.  She uses albuterol for rescue inhaler.  She reports having to use this maybe once a week or less.  She denies waking up coughing.  She recently had tonsils removed which is helped with her breathing especially overnight.  She reports a rash to her hands.  She has been diagnosed with eczema in the past.  She is using cortisone over-the-counter with mild improvement in her itching and rash appearance.  Past Medical History:  Diagnosis Date   Asthma    Depression    Diabetes mellitus without complication (HCC)    says it is pre-diabetes   Eczema    Ingrown toenail    Optic nerve edema    Seasonal allergies       Family History  Problem Relation Age of Onset   Diabetes Other    Hypertension Other    Breast cancer Maternal Grandmother    Obesity Maternal Grandmother    Cancer Paternal Grandfather        unsure of type   Asthma Paternal Aunt    Obesity Paternal Grandmother     Social History   Social History Narrative   12th grade at [employer].   Right-handed.   Lives at home with father and grandmother.   Some day use of caffeine.   Social History   Tobacco Use   Smoking status: Never   Smokeless tobacco: Never  Substance Use Topics   Alcohol use: No     Current Meds  Medication Sig   albuterol (VENTOLIN  HFA) 108 (90 Base) MCG/ACT inhaler Inhale 1-2 puffs into the lungs every 6 (six) hours as needed for wheezing or shortness of breath.   cetirizine (ZYRTEC) 10 MG tablet Take 10 mg by mouth daily.   metFORMIN (GLUCOPHAGE) 500 MG tablet Take 1 tablet (500 mg total) by mouth 2 (two) times daily with a meal.   naproxen (NAPROSYN) 500 MG tablet Take 1 tablet (500 mg total) by mouth 2 (two) times daily as needed.   ondansetron (ZOFRAN ODT) 4 MG disintegrating tablet Take 1 tablet (4 mg total) by mouth every 8 (eight) hours as needed.   sertraline (ZOLOFT) 50 MG tablet Take 1 tablet (50 mg total) by mouth daily.   Spacer/Aero-Holding Chambers (AEROCHAMBER PLUS) inhaler Use as instructed   topiramate (TOPAMAX) 100 MG tablet Take 2 tablets (200 mg total) by mouth at bedtime.   [DISCONTINUED] buPROPion (WELLBUTRIN SR) 200 MG 12 hr tablet Take 200 mg by mouth daily.   [DISCONTINUED] cyclobenzaprine (FLEXERIL) 5 MG tablet Take 1 tablet (5 mg total) by mouth 3 (three) times daily as needed for muscle spasms.   [DISCONTINUED] FLOVENT HFA 110 MCG/ACT inhaler INHALE 2 PUFFS (220 MCG) BY INHALATION ROUTE 2 TIMES PER DAY FOR 30  DAYS   [DISCONTINUED] prazosin (MINIPRESS) 1 MG capsule TAKE 1 CAPSULE BY MOUTH EVERYDAY AT BEDTIME   [DISCONTINUED] triamcinolone cream (KENALOG) 0.1 % Apply 1 application topically 2 (two) times daily.    ROS:  Review of Systems  Respiratory:  Negative for shortness of breath.   Cardiovascular:  Negative for chest pain.  Skin:  Positive for rash.  Neurological:  Positive for headaches.  Psychiatric/Behavioral:  Positive for depression. Negative for suicidal ideas. The patient is nervous/anxious.     Objective:   Today's Vitals: BP 132/84 (BP Location: Right Arm, Patient Position: Sitting, Cuff Size: Normal)   Pulse (!) 106   Temp 98.4 F (36.9 C) (Oral)   Ht 5\' 1"  (1.549 m)   Wt (!) 384 lb 3.2 oz (174.3 kg)   SpO2 97%   BMI 72.59 kg/m     12/04/2021    1:08 PM 06/24/2021     7:51 AM 06/25/2020    7:30 AM  Vitals with BMI  Height 5\' 1"  5\' 1"  5\' 1"   Weight 384 lbs 3 oz 387 lbs 383 lbs  BMI 72.63 73.16 72.4  Systolic 132 160 06/27/2020  Diastolic 84 90 77  Pulse 106 101 94      12/04/2021    1:26 PM 12/04/2021    1:16 PM 11/03/2017    9:29 AM  PHQ9 SCORE ONLY  PHQ-9 Total Score 14 1 7       12/04/2021    1:25 PM  GAD 7 : Generalized Anxiety Score  Nervous, Anxious, on Edge 3  Control/stop worrying 2  Worry too much - different things 3  Trouble relaxing 2  Restless 1  Easily annoyed or irritable 3  Afraid - awful might happen 2  Total GAD 7 Score 16       Physical Exam Vitals reviewed.  Constitutional:      General: She is not in acute distress.    Appearance: Normal appearance. She is obese.  HENT:     Head: Normocephalic and atraumatic.  Neck:     Vascular: No carotid bruit.  Cardiovascular:     Rate and Rhythm: Normal rate and regular rhythm.     Pulses: Normal pulses.     Heart sounds: Normal heart sounds.  Pulmonary:     Effort: Pulmonary effort is normal.     Breath sounds: Normal breath sounds.  Skin:    General: Skin is warm and dry.  Neurological:     General: No focal deficit present.     Mental Status: She is alert and oriented to person, place, and time.  Psychiatric:        Mood and Affect: Mood normal.        Behavior: Behavior normal.        Judgment: Judgment normal.         Assessment and Plan   1. Mild intermittent asthma without complication   2. Anxiety and depression   3. Eczema, unspecified type   4. Class 3 severe obesity without serious comorbidity with body mass index (BMI) greater than or equal to 70 in adult, unspecified obesity type (HCC)      Plan: 1.  Patient encouraged to continue taking albuterol inhaler as needed.  She is encouraged to let me know if she needs to take this on a daily basis.  May consider ICS at that point if necessary.  Patient requesting nebulized albuterol to be prescribed as  needed, will consider prescription once we get updated blood  work including Conservation officer, historic buildingsmetabolic panel. 2.  We will prescribe low-dose of Zoloft, patient will follow-up for close monitoring in 4 to 6 weeks. 3.  Triamcinolone prescribed for patient to use twice a day for up to 2 weeks.  She was also encouraged to use CeraVe lotion and/or Vaseline. 4.  Blood work ordered today for further evaluation, further recommendations may be made based upon these results.   Tests ordered Orders Placed This Encounter  Procedures   TSH   Hemoglobin A1c   Lipid panel   Comprehensive metabolic panel   CBC   T3, free   T4, free   Ambulatory referral to Psychiatry      Meds ordered this encounter  Medications   sertraline (ZOLOFT) 50 MG tablet    Sig: Take 1 tablet (50 mg total) by mouth daily.    Dispense:  30 tablet    Refill:  3    Order Specific Question:   Supervising Provider    Answer:   Pincus SanesBURNS, STACY J [[id number]]   DISCONTD: triamcinolone cream (KENALOG) 0.1 %    Sig: Apply 1 application. topically 2 (two) times daily.    Dispense:  30 g    Refill:  0    Order Specific Question:   Supervising Provider    Answer:   Pincus SanesBURNS, STACY J [[id number]]   triamcinolone cream (KENALOG) 0.1 %    Sig: Apply 1 application. topically 2 (two) times daily.    Dispense:  30 g    Refill:  0    Order Specific Question:   Supervising Provider    Answer:   Pincus SanesBURNS, STACY J [id number]    Patient to follow-up in 4 to 6 weeks or sooner as needed.  Elenore PaddySARAH E Sissi Padia, NP

## 2021-12-26 ENCOUNTER — Other Ambulatory Visit: Payer: Self-pay | Admitting: Nurse Practitioner

## 2021-12-26 DIAGNOSIS — F32A Depression, unspecified: Secondary | ICD-10-CM

## 2021-12-26 DIAGNOSIS — F419 Anxiety disorder, unspecified: Secondary | ICD-10-CM

## 2022-01-08 ENCOUNTER — Ambulatory Visit: Payer: Medicaid Other | Admitting: Nurse Practitioner

## 2022-01-08 VITALS — BP 130/72 | HR 100 | Temp 98.3°F | Ht 61.0 in | Wt 379.0 lb

## 2022-01-08 DIAGNOSIS — J4520 Mild intermittent asthma, uncomplicated: Secondary | ICD-10-CM | POA: Diagnosis not present

## 2022-01-08 DIAGNOSIS — L309 Dermatitis, unspecified: Secondary | ICD-10-CM | POA: Diagnosis not present

## 2022-01-08 DIAGNOSIS — R7303 Prediabetes: Secondary | ICD-10-CM

## 2022-01-08 DIAGNOSIS — E785 Hyperlipidemia, unspecified: Secondary | ICD-10-CM

## 2022-01-08 DIAGNOSIS — F419 Anxiety disorder, unspecified: Secondary | ICD-10-CM

## 2022-01-08 DIAGNOSIS — L680 Hirsutism: Secondary | ICD-10-CM | POA: Insufficient documentation

## 2022-01-08 DIAGNOSIS — F32A Depression, unspecified: Secondary | ICD-10-CM

## 2022-01-08 DIAGNOSIS — Z6841 Body Mass Index (BMI) 40.0 and over, adult: Secondary | ICD-10-CM

## 2022-01-08 LAB — LUTEINIZING HORMONE: LH: 11.8 m[IU]/mL

## 2022-01-08 LAB — FOLLICLE STIMULATING HORMONE: FSH: 2.3 m[IU]/mL

## 2022-01-08 MED ORDER — ALBUTEROL SULFATE HFA 108 (90 BASE) MCG/ACT IN AERS: 1.0000 | INHALATION_SPRAY | Freq: Four times a day (QID) | RESPIRATORY_TRACT | 6 refills | Status: DC | PRN

## 2022-01-08 MED ORDER — SERTRALINE HCL 100 MG PO TABS: 100.0000 mg | ORAL_TABLET | Freq: Every day | ORAL | 3 refills | Status: DC

## 2022-01-08 MED ORDER — BETAMETHASONE DIPROPIONATE 0.05 % EX CREA: TOPICAL_CREAM | Freq: Two times a day (BID) | CUTANEOUS | 0 refills | Status: DC

## 2022-01-08 NOTE — Assessment & Plan Note (Signed)
>>  ASSESSMENT AND PLAN FOR HYPERLIPIDEMIA WRITTEN ON 01/08/2022 10:49 AM BY Adella Agee E, NP  We discussed lifestyle modification.  Specifically Mediterranean diet and participating in 150 minutes of exercise per week.  She reports her understanding.  She was also encouraged to follow-up with weight loss clinic as currently scheduled.

## 2022-01-08 NOTE — Assessment & Plan Note (Signed)
GAD-7 score improved compared to last office visit however PHQ-9 score worsened.  Will increase Zoloft to 100 mg/day, patient was warned of suicidal ideation and to notify me if this were to occur.  She will follow-up in 4 to 6 weeks.

## 2022-01-08 NOTE — Assessment & Plan Note (Signed)
Chronic, failed triamcinolone trial betamethasone.  If symptoms continue to persist may consider referral to dermatology.

## 2022-01-08 NOTE — Assessment & Plan Note (Signed)
Chronic, stable.  Albuterol inhaler ordered to be taken as needed and prescribed to patient's pharmacy.  May consider addition of ICS in the future.

## 2022-01-08 NOTE — Assessment & Plan Note (Signed)
We discussed lifestyle modification.  Specifically Mediterranean diet and participating in 150 minutes of exercise per week.  She reports her understanding.  She was also encouraged to follow-up with weight loss clinic as currently scheduled.

## 2022-01-08 NOTE — Assessment & Plan Note (Signed)
Concern for possible PCOS.  Will initiate work-up today.  We will check blood work including LH, FSH, testosterone level, and have her undergo pelvic ultrasound.  Patient uncomfortable transvaginal and would like to try transabdominal initially.  Transabdominal ultrasound ordered.  Patient is already on metformin may consider spironolactone pending work-up results.

## 2022-01-08 NOTE — Assessment & Plan Note (Signed)
We discussed lifestyle modification.  Specifically Mediterranean diet and participating in 150 minutes of exercise per week.  She reports her understanding.  She was also encouraged to follow-up with weight loss clinic as currently scheduled. 

## 2022-01-08 NOTE — Progress Notes (Signed)
Established Patient Office Visit  Subjective   Patient ID: Pamela Bowman, female    DOB: 03-19-2001  Age: 21 y.o. MRN: [id number]  Chief Complaint  Patient presents with   69mo follow up    Increase anxiety meds   skin irritation    Right hand    Patient has today for follow-up for the above.  She has anxiety and depression and we started her on Zoloft at last office visit.  She is on 50 mg daily and is tolerating it well but feels that she is not getting a full effect and would like to consider upping the dose.  She denies suicidal ideation.  She continues to have a rash in between the digits of her right hand.  She treated with triamcinolone without much improvement in her symptoms.  It is itchy.   She has obesity and is scheduled to go to a weight loss clinic in the next couple of weeks.  She also reports hirsutism and irregular menstrual cycles.  She denies heavy bleeding.  She reports that she vaguely remembers having testosterone levels checked in the past and was told that they were elevated.  She is on metformin currently for off label treatment of prediabetes.  Blood work today did show hyperlipidemia with LDL of 149 and prediabetes with A1c of 5.8.  She has intermittent asthma and is requesting refill on her as needed albuterol inhaler today.    Review of Systems  Respiratory:  Negative for shortness of breath.   Cardiovascular:  Negative for chest pain.  Neurological:  Negative for dizziness and headaches.  Psychiatric/Behavioral:  Positive for depression. Negative for suicidal ideas. The patient is nervous/anxious.       Objective:     BP 130/72 (BP Location: Right Arm, Patient Position: Sitting, Cuff Size: Large)   Pulse 100   Temp 98.3 F (36.8 C) (Oral)   Ht 5\' 1"  (1.549 m)   Wt (!) 379 lb (171.9 kg)   SpO2 97%   BMI 71.61 kg/m       01/08/2022   10:22 AM 12/04/2021    1:26 PM 12/04/2021    1:16 PM  PHQ9 SCORE ONLY  PHQ-9 Total Score 17 14 1        01/08/2022   10:24 AM 12/04/2021    1:25 PM  GAD 7 : Generalized Anxiety Score  Nervous, Anxious, on Edge 3 3  Control/stop worrying 2 2  Worry too much - different things 2 3  Trouble relaxing 1 2  Restless 0 1  Easily annoyed or irritable 2 3  Afraid - awful might happen 1 2  Total GAD 7 Score 11 16      Physical Exam Vitals reviewed.  Constitutional:      General: She is not in acute distress.    Appearance: Normal appearance. She is obese.  HENT:     Head: Normocephalic and atraumatic.  Neck:     Vascular: No carotid bruit.  Cardiovascular:     Rate and Rhythm: Normal rate and regular rhythm.     Pulses: Normal pulses.     Heart sounds: Normal heart sounds.  Pulmonary:     Effort: Pulmonary effort is normal.     Breath sounds: Normal breath sounds.  Skin:    General: Skin is warm and dry.  Neurological:     General: No focal deficit present.     Mental Status: She is alert and oriented to person, place, and time.  Psychiatric:  Mood and Affect: Mood normal.        Behavior: Behavior normal.        Judgment: Judgment normal.      No results found for any visits on 01/08/22.    The ASCVD Risk score (Arnett DK, et al., 2019) failed to calculate for the following reasons:   The 2019 ASCVD risk score is only valid for ages 1 to 87    Assessment & Plan:   Problem List Items Addressed This Visit       Respiratory   Mild intermittent asthma without complication    Chronic, stable.  Albuterol inhaler ordered to be taken as needed and prescribed to patient's pharmacy.  May consider addition of ICS in the future.      Relevant Medications   albuterol (VENTOLIN HFA) 108 (90 Base) MCG/ACT inhaler     Musculoskeletal and Integument   Eczema    Chronic, failed triamcinolone trial betamethasone.  If symptoms continue to persist may consider referral to dermatology.      Relevant Medications   betamethasone dipropionate 0.05 % cream   Hirsutism     Concern for possible PCOS.  Will initiate work-up today.  We will check blood work including LH, FSH, testosterone level, and have her undergo pelvic ultrasound.  Patient uncomfortable transvaginal and would like to try transabdominal initially.  Transabdominal ultrasound ordered.  Patient is already on metformin may consider spironolactone pending work-up results.      Relevant Orders   US Pelvis Complete   Testos,Total,Free and SHBG (Female)   LH   FSH     Other   Pre-diabetes    We discussed lifestyle modification.  Specifically Mediterranean diet and participating in 150 minutes of exercise per week.  She reports her understanding.  She was also encouraged to follow-up with weight loss clinic as currently scheduled.      Obesity    We discussed lifestyle modification.  Specifically Mediterranean diet and participating in 150 minutes of exercise per week.  She reports her understanding.  She was also encouraged to follow-up with weight loss clinic as currently scheduled.      Hyperlipidemia    We discussed lifestyle modification.  Specifically Mediterranean diet and participating in 150 minutes of exercise per week.  She reports her understanding.  She was also encouraged to follow-up with weight loss clinic as currently scheduled.      Anxiety and depression - Primary    GAD-7 score improved compared to last office visit however PHQ-9 score worsened.  Will increase Zoloft to 100 mg/day, patient was warned of suicidal ideation and to notify me if this were to occur.  She will follow-up in 4 to 6 weeks.      Relevant Medications   sertraline (ZOLOFT) 100 MG tablet   Other Relevant Orders   Ambulatory referral to Psychiatry    Return in about 1 month (around 02/08/2022) for Follow-up with Jerry Haugen.    Elenore Paddy, NP

## 2022-01-11 LAB — TESTOS,TOTAL,FREE AND SHBG (FEMALE)
Free Testosterone: 5.5 pg/mL (ref 0.1–6.4)
Sex Hormone Binding: 35 nmol/L (ref 17–124)
Testosterone, Total, LC-MS-MS: 41 ng/dL (ref 2–45)

## 2022-01-12 ENCOUNTER — Encounter (INDEPENDENT_AMBULATORY_CARE_PROVIDER_SITE_OTHER): Payer: Self-pay | Admitting: Family Medicine

## 2022-01-12 ENCOUNTER — Ambulatory Visit (INDEPENDENT_AMBULATORY_CARE_PROVIDER_SITE_OTHER): Payer: Medicaid Other | Admitting: Family Medicine

## 2022-01-12 VITALS — BP 123/77 | HR 93 | Temp 98.8°F | Ht 61.0 in | Wt 371.0 lb

## 2022-01-12 DIAGNOSIS — F39 Unspecified mood [affective] disorder: Secondary | ICD-10-CM | POA: Diagnosis not present

## 2022-01-12 DIAGNOSIS — J45909 Unspecified asthma, uncomplicated: Secondary | ICD-10-CM

## 2022-01-12 DIAGNOSIS — R5383 Other fatigue: Secondary | ICD-10-CM | POA: Diagnosis not present

## 2022-01-12 DIAGNOSIS — Z1331 Encounter for screening for depression: Secondary | ICD-10-CM | POA: Diagnosis not present

## 2022-01-12 DIAGNOSIS — R0602 Shortness of breath: Secondary | ICD-10-CM | POA: Diagnosis not present

## 2022-01-12 DIAGNOSIS — R7303 Prediabetes: Secondary | ICD-10-CM | POA: Diagnosis not present

## 2022-01-12 DIAGNOSIS — Z6841 Body Mass Index (BMI) 40.0 and over, adult: Secondary | ICD-10-CM

## 2022-01-13 ENCOUNTER — Other Ambulatory Visit: Payer: Medicaid Other

## 2022-01-14 ENCOUNTER — Ambulatory Visit
Admission: RE | Admit: 2022-01-14 | Discharge: 2022-01-14 | Disposition: A | Discharge status: Home / Self Care | Payer: Medicaid Other | Source: Ambulatory Visit | Attending: Nurse Practitioner | Admitting: Nurse Practitioner

## 2022-01-14 DIAGNOSIS — L680 Hirsutism: Secondary | ICD-10-CM

## 2022-01-16 NOTE — Progress Notes (Signed)
Chief Complaint:   OBESITY Pamela Bowman (MR# [id number]) is a 21 y.o. female who presents for evaluation and treatment of obesity and related comorbidities. Current BMI is Body mass index is 70.1 kg/m. Pamela Bowman has been struggling with her weight for many years and has been unsuccessful in either losing weight, maintaining weight loss, or reaching her healthy weight goal.  Pamela Bowman is currently in the action stage of change and ready to dedicate time achieving and maintaining a healthier weight. Pamela Bowman is interested in becoming our patient and working on intensive lifestyle modifications including (but not limited to) diet and exercise for weight loss.  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 Pt drinks juice and sweet tea and 2 regular sodas a day. Her worst habit is not eating 3 meals a day. She usually always eats dinner and 50% of the time she eats lunch.  Pamela Bowman's habits were reviewed today and are as follows: she thinks her family will eat healthier with her, her desired weight loss is 121 lbs, she has been heavy most of her life, she started gaining weight around age 70, her heaviest weight ever was 388 pounds, she has significant food cravings issues, she skips meals frequently, she is frequently drinking liquids with calories, she frequently makes poor food choices, and she struggles with emotional eating.  Depression Screen Pamela Bowman's Food and Mood (modified PHQ-9) score was 3.     01/12/2022    7:46 AM  Depression screen PHQ 2/9  Decreased Interest 1  Down, Depressed, Hopeless 1  PHQ - 2 Score 2  Altered sleeping 0  Tired, decreased energy 1  Change in appetite 0  Feeling bad or failure about yourself  0  Trouble concentrating 0  Moving slowly or fidgety/restless 0  Suicidal thoughts 0  PHQ-9 Score 3  Difficult doing work/chores Somewhat difficult   Subjective:   1. Other fatigue Pamela Bowman admits to daytime somnolence and denies waking up still tired.  Patient has a history of symptoms of daytime fatigue. Pamela Bowman generally gets 8 hours of sleep per night, and states that she has generally restful sleep. Snoring is not present. Apneic episodes are not present. Epworth Sleepiness Score is 3.   2. SOB (shortness of breath) on exertion Pamela Bowman notes increasing shortness of breath with exercising and seems to be worsening over time with weight gain. She notes getting out of breath sooner with activity than she used to. This has gotten worse recently. Pamela Bowman denies shortness of breath at rest or orthopnea.  3. Prediabetes Pt was diagnosed 5-6 years ago. She was on Metformin in the past for 4 years but got a new PCP who never started pt on it or continued it. Pt is having PCOS work up currently.  4. Mood disorder (HCC), with emotional eating Pamela Bowman recently started sertraline for depression and anxiety a month and half ago and is now on 100 mg and reports it is working well.  5. Reactive airway disease without complication, unspecified asthma severity, unspecified whether persistent Pt reports she uses inhaler less than once a week and ha no concerns.  Assessment/Plan:   Orders Placed This Encounter  Procedures   EKG 12-Lead    There are no discontinued medications.   No orders of the defined types were placed in this encounter.    1. Other fatigue Pamela Bowman does feel that her weight is causing her energy to be lower than it should be. Fatigue may be  related to obesity, depression or many other causes. Labs will be ordered, and in the meanwhile, Mertie will focus on self care including making healthy food choices, increasing physical activity and focusing on stress reduction.  - EKG 12-Lead  2. SOB (shortness of breath) on exertion Pamela Bowman does feel that she gets out of breath more easily that she used to when she exercises. Pamela Bowman's shortness of breath appears to be obesity related and exercise induced. She has agreed to work on weight loss and  gradually increase exercise to treat her exercise induced shortness of breath. Will continue to monitor closely.  3. Prediabetes Pamela Bowman will follow prudent nutritional plan, work on weight loss, exercise, decrease liquid calories, and decreasing simple carbohydrates to help decrease the risk of diabetes.   4. Mood disorder (HCC), with emotional eating Behavior modification techniques were discussed today to help Pamela Bowman deal with her emotional/non-hunger eating behaviors.  Orders and follow up as documented in patient record. Continue with psychiatrist that pt was recently referred to for counseling as needed, but pt feels like she doesn't feel like she needs it.   5. Reactive airway disease without complication, unspecified asthma severity, unspecified whether persistent Symptoms stable. Continue as needed meds per PCP.  6. Depression screen Pamela Bowman had a negative depression screening. Depression is commonly associated with obesity and often results in emotional eating behaviors. We will monitor this closely and work on CBT to help improve the non-hunger eating patterns. Referral to Psychology may be required if no improvement is seen as she continues in our clinic.  7. Class 3 severe obesity with serious comorbidity and body mass index (BMI) greater than or equal to 70 in adult, unspecified obesity type (HCC) Pamela Bowman is currently in the action stage of change and her goal is to continue with weight loss efforts. I recommend Pamela Bowman begin the structured treatment plan as follows:  She has agreed to the Category 4 Plan.  Pt recently had labs done with PCP on 12/04/2021 (CBC, CMP, TSH, FT4, T3, FLP, A1c) and is getting an ultrasound in the near future to rule out PCOS.  Exercise goals:  As is    Behavioral modification strategies: increasing lean protein intake, decreasing simple carbohydrates, and planning for success.  She was informed of the importance of frequent follow-up visits to maximize her  success with intensive lifestyle modifications for her multiple health conditions. She was informed we would discuss her lab results at her next visit unless there is a critical issue that needs to be addressed sooner. Pamela Bowman agreed to keep her next visit at the agreed upon time to discuss these results.  Objective:   Blood pressure 123/77, pulse 93, temperature 98.8 F (37.1 C), height 5\' 1"  (1.549 m), weight (!) 371 lb (168.3 kg), last menstrual period 01/02/2022, SpO2 98 %. Body mass index is 70.1 kg/m.  EKG: Normal sinus rhythm, rate 93.  Indirect Calorimeter completed today shows a VO2 of 419 and a REE of 2894. NO WEIGHT STRIP  General: Cooperative, alert, well developed, in no acute distress. HEENT: Conjunctivae and lids unremarkable. Cardiovascular: Regular rhythm.  Lungs: Normal work of breathing. Neurologic: No focal deficits.   Lab Results  Component Value Date   CREATININE 0.98 12/04/2021   BUN 9 12/04/2021   NA 137 12/04/2021   K 4.0 12/04/2021   CL 103 12/04/2021   CO2 27 12/04/2021   Lab Results  Component Value Date   ALT 27 12/04/2021   AST 32 12/04/2021   ALKPHOS  46 12/04/2021   BILITOT 0.7 12/04/2021   Lab Results  Component Value Date   HGBA1C 5.8 12/04/2021   HGBA1C 5.8 (A) 01/31/2020   HGBA1C 5.5 08/09/2017   HGBA1C 5.6 01/11/2017   HGBA1C 5.2 09/06/2016   No results found for: "INSULIN" Lab Results  Component Value Date   TSH 3.41 12/04/2021   Lab Results  Component Value Date   CHOL 200 12/04/2021   HDL 36.20 (L) 12/04/2021   LDLCALC 149 (H) 12/04/2021   TRIG 74.0 12/04/2021   CHOLHDL 6 12/04/2021   Lab Results  Component Value Date   WBC 6.1 12/04/2021   HGB 13.5 12/04/2021   HCT 42.0 12/04/2021   MCV 89.7 12/04/2021   PLT 330.0 12/04/2021    Attestation Statements:   Reviewed by clinician on day of visit: allergies, medications, problem list, medical history, surgical history, family history, social history, and previous  encounter notes.  I, Kyung Rudd, BS, CMA, am acting as transcriptionist for [employer], DO.   I have reviewed the above documentation for accuracy and completeness, and I agree with the above. Carlye Grippe, D.O.  The 21st Century Cures Act was signed into law in 2016 which includes the topic of electronic health records.  This provides immediate access to information in MyChart.  This includes consultation notes, operative notes, office notes, lab results and pathology reports.  If you have any questions about what you read please let us know at your next visit so we can discuss your concerns and take corrective action if need be.  We are right here with you.

## 2022-01-26 ENCOUNTER — Encounter (INDEPENDENT_AMBULATORY_CARE_PROVIDER_SITE_OTHER): Payer: Self-pay | Admitting: Family Medicine

## 2022-01-26 ENCOUNTER — Ambulatory Visit (INDEPENDENT_AMBULATORY_CARE_PROVIDER_SITE_OTHER): Payer: Medicaid Other | Admitting: Family Medicine

## 2022-01-26 VITALS — BP 122/80 | HR 98 | Temp 98.1°F | Ht 61.0 in | Wt 364.0 lb

## 2022-01-26 DIAGNOSIS — G43709 Chronic migraine without aura, not intractable, without status migrainosus: Secondary | ICD-10-CM | POA: Diagnosis not present

## 2022-01-26 DIAGNOSIS — R7303 Prediabetes: Secondary | ICD-10-CM | POA: Diagnosis not present

## 2022-01-26 DIAGNOSIS — E66813 Obesity, class 3: Secondary | ICD-10-CM

## 2022-01-26 DIAGNOSIS — Z7984 Long term (current) use of oral hypoglycemic drugs: Secondary | ICD-10-CM

## 2022-01-26 DIAGNOSIS — Z6841 Body Mass Index (BMI) 40.0 and over, adult: Secondary | ICD-10-CM

## 2022-01-26 DIAGNOSIS — E785 Hyperlipidemia, unspecified: Secondary | ICD-10-CM | POA: Diagnosis not present

## 2022-01-26 DIAGNOSIS — F39 Unspecified mood [affective] disorder: Secondary | ICD-10-CM

## 2022-01-26 MED ORDER — METFORMIN HCL 500 MG PO TABS: ORAL_TABLET | ORAL | 0 refills | Status: DC

## 2022-01-30 NOTE — Progress Notes (Signed)
Chief Complaint:   OBESITY Pamela Bowman is here to discuss her progress with her obesity treatment plan along with follow-up of her obesity related diagnoses. Pamela Bowman is on the Category 4 Plan and states she is following her eating plan approximately 80% of the time. Pamela Bowman states she is not currently exercising.  Today's visit was #: 2 Starting weight: 371 lbs Starting date: 01/12/2022 Today's weight: 364 lbs Today's date: 01/26/2022 Total lbs lost to date: 7 Total lbs lost since last in-office visit: 7  Interim History: Pamela Bowman is here today for her first follow-up office visit since starting the program with [place].  All blood work/ lab tests that were recently ordered by myself or an outside provider were reviewed with patient today per their request.   Extended time was spent counseling her on all new disease processes that were discovered or preexisting ones that are affected by BMI.  she understands that many of these abnormalities will need to monitored regularly along with the current treatment plan of prudent dietary changes, in which we are making each and every office visit, to improve these health parameters. Pamela Bowman was on her menstrual cycle last week, had sweets cravings, and had a Kit Kat bar. She skips breakfast daily and is only having a tortilla wrap at lunch with "a couple of chicken tenders", cucumbers, tomatoes, onions, and ranch dressing. Dinner varies. Pt did cut down to 1 can of soda a day and drinks sparkling water.  Subjective:   1. Prediabetes Discussed labs with patient today. Pamela Bowman A1c is 5.8. Her paternal and maternal grandmothers both have diabetes. She is not sure about her siblings or mother's history. Pt reports carb and sweets cravings.  2. Mood disorder ([place]) Pt with history of anxiety and depression. Medication: Zoloft per PCP.  3. Hyperlipidemia, unspecified hyperlipidemia type New. Discussed labs with patient today. Pamela Bowman has an elevated LDL at 149 and  HDL is low at 36.2. She is unsure of early CAD family history. Medication: None  4. 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 She has been on Topamax for 4 years. Migraines are currently well controlled.  Assessment/Plan:  No orders of the defined types were placed in this encounter.   Medications Discontinued During This Encounter  Medication Reason   metFORMIN (GLUCOPHAGE) 500 MG tablet      Meds ordered this encounter  Medications   metFORMIN (GLUCOPHAGE) 500 MG tablet    Sig: 1/2 po with lunch daily and 1/2 tab with dinner    Dispense:  30 tablet    Refill:  0    30 d supply;  ** OV for RF **   Do not send RF request     1. Prediabetes No change from prior; still not at goal. Pamela Bowman will continue to work on weight loss, exercise, and decreasing simple carbohydrates to help decrease the risk of diabetes. Counseling done and handouts on disease given to pt. Start Metformin 500 mg to help with carb cravings/hunger.  Start- metFORMIN (GLUCOPHAGE) 500 MG tablet; 1/2 po with lunch daily and 1/2 tab with dinner  Dispense: 30 tablet; Refill: 0  2. Mood disorder ([place]) Thyroid labs within normal limits. Pt's mood is stable.   3. Hyperlipidemia, unspecified hyperlipidemia type Cardiovascular risk and specific lipid/LDL goals reviewed.  We discussed several lifestyle modifications today and Pamela Bowman will continue to work on diet, exercise and weight loss efforts. Orders and follow up as documented in patient  record. Continue prudent nutritional plan.  Counseling Intensive lifestyle modifications are the first line treatment for this issue. Dietary changes: Increase soluble fiber. Decrease simple carbohydrates. Exercise changes: Moderate to vigorous-intensity aerobic activity 150 minutes per week if tolerated. Lipid-lowering medications: see documented in medical record.  4. Chronic migraine w/o aura w/o status migrainosus, not  intractable Discussed with pt how topiramate can help with cravings, as well. Educated pt.  5. Class 3 severe obesity with serious comorbidity and body mass index (BMI) greater than or equal to 70 in adult, unspecified obesity type (HCC) Pamela Bowman is currently in the action stage of change. As such, her goal is to continue with weight loss efforts. She has agreed to the Category 4 Plan.   Labs drawn by PCP on 12/04/2021 were reviewed with pt today.  Exercise goals:  Ok for pt to walk 5-10 minutes a day.  Behavioral modification strategies: increasing lean protein intake, decreasing simple carbohydrates, decreasing liquid calories, and no skipping meals.  Pamela Bowman has agreed to follow-up with our clinic in 2 weeks with first available provider. She was informed of the importance of frequent follow-up visits to maximize her success with intensive lifestyle modifications for her multiple health conditions.   Objective:   Blood pressure 122/80, pulse 98, temperature 98.1 F (36.7 C), height 5' 1"  (1.549 m), weight (!) 364 lb (165.1 kg), last menstrual period 01/02/2022, SpO2 97 %. Body mass index is 68.78 kg/m.  General: Cooperative, alert, well developed, in no acute distress. HEENT: Conjunctivae and lids unremarkable. Cardiovascular: Regular rhythm.  Lungs: Normal work of breathing. Neurologic: No focal deficits.   Lab Results  Component Value Date   CREATININE 0.98 12/04/2021   BUN 9 12/04/2021   NA 137 12/04/2021   K 4.0 12/04/2021   CL 103 12/04/2021   CO2 27 12/04/2021   Lab Results  Component Value Date   ALT 27 12/04/2021   AST 32 12/04/2021   ALKPHOS 46 12/04/2021   BILITOT 0.7 12/04/2021   Lab Results  Component Value Date   HGBA1C 5.8 12/04/2021   HGBA1C 5.8 (A) 01/31/2020   HGBA1C 5.5 08/09/2017   HGBA1C 5.6 01/11/2017   HGBA1C 5.2 09/06/2016   No results found for: "INSULIN" Lab Results  Component Value Date   TSH 3.41 12/04/2021   Lab Results  Component Value  Date   CHOL 200 12/04/2021   HDL 36.20 (L) 12/04/2021   LDLCALC 149 (H) 12/04/2021   TRIG 74.0 12/04/2021   CHOLHDL 6 12/04/2021   No results found for: "VD25OH" Lab Results  Component Value Date   WBC 6.1 12/04/2021   HGB 13.5 12/04/2021   HCT 42.0 12/04/2021   MCV 89.7 12/04/2021   PLT 330.0 12/04/2021    Attestation Statements:   Reviewed by clinician on day of visit: allergies, medications, problem list, medical history, surgical history, family history, social history, and previous encounter notes.  Time spent on visit including pre-visit chart review and post-visit care and charting was 40 minutes.   I, Kathlene November, BS, CMA, am acting as transcriptionist for [employer], DO.   I have reviewed the above documentation for accuracy and completeness, and I agree with the above. Pamela Bowman, D.O.  The [place] was signed into law in 2016 which includes the topic of electronic health records.  This provides immediate access to information in MyChart.  This includes consultation notes, operative notes, office notes, lab results and pathology reports.  If you have any questions  about what you read please let us know at your next visit so we can discuss your concerns and take corrective action if need be.  We are right here with you.

## 2022-02-09 ENCOUNTER — Other Ambulatory Visit: Payer: Self-pay | Admitting: Nurse Practitioner

## 2022-02-09 DIAGNOSIS — F32A Depression, unspecified: Secondary | ICD-10-CM

## 2022-02-10 ENCOUNTER — Encounter (INDEPENDENT_AMBULATORY_CARE_PROVIDER_SITE_OTHER): Payer: Self-pay

## 2022-02-11 ENCOUNTER — Ambulatory Visit: Payer: Medicaid Other | Admitting: Nurse Practitioner

## 2022-02-11 VITALS — BP 132/86 | HR 103 | Temp 99.4°F | Ht 61.0 in | Wt 371.0 lb

## 2022-02-11 DIAGNOSIS — Z30019 Encounter for initial prescription of contraceptives, unspecified: Secondary | ICD-10-CM | POA: Diagnosis not present

## 2022-02-11 DIAGNOSIS — E282 Polycystic ovarian syndrome: Secondary | ICD-10-CM

## 2022-02-11 DIAGNOSIS — Z6841 Body Mass Index (BMI) 40.0 and over, adult: Secondary | ICD-10-CM

## 2022-02-11 DIAGNOSIS — L680 Hirsutism: Secondary | ICD-10-CM

## 2022-02-11 DIAGNOSIS — L989 Disorder of the skin and subcutaneous tissue, unspecified: Secondary | ICD-10-CM | POA: Insufficient documentation

## 2022-02-11 MED ORDER — SPIRONOLACTONE 25 MG PO TABS: 25.0000 mg | ORAL_TABLET | Freq: Every day | ORAL | 1 refills | Status: DC

## 2022-02-11 NOTE — Patient Instructions (Signed)
Come back for labs in 7-10 days for repeat labs. Order is in place, you may walk in without an appointment.   MAKE SURE TO USE CONTRACEPTION IF YOU DECIDE TO BECOME SEXUALLY ACTIVE WHILE ON SPIRONOLACTONE. Referral to OBGYN make today to discuss contraception options. You may also contact the health department for this as well.

## 2022-02-11 NOTE — Assessment & Plan Note (Addendum)
Diagnosis of PCOS based on ultrasound and symptoms. Spirolactone 25 mg PO daily ordered today for hirsutism. Labs ordered at last visit on the high side of normal. Discussed side effects and need for BMP in 7-10 days to evaluate electrolyte function. LH - 11.8 FSH - 2.3 Testosterone level - 41

## 2022-02-11 NOTE — Assessment & Plan Note (Signed)
Chronic. Patient attending the weight loss clinic 2/wk for a month now and has lost 7 lbs with healthy lifestyle changes. Patient was ordered Metformin 500 mg PO BID by her weight loss provider.

## 2022-02-11 NOTE — Assessment & Plan Note (Signed)
Eczema on bilateral hands has continued to get worse. 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 Patient requesting a referral to Dermatologist to diagnose and treat. Patient advised to continue zyrtec and OTC cortisone cream for itching.  Also educated patient on non scented soaps, detergents and lotions, avoiding hot water and keeping hands moisturized.

## 2022-02-11 NOTE — Assessment & Plan Note (Addendum)
Patient to start Spirolactone 25 mg PO daily for hirsutism. Referral to Gynecology for contraceptives. Advised on taking this medication in the morning and possible side effects (dizziness, hyperkalemia, N/V/D, and electrolyte imbalance). Patient advised to come back in 7-10 days for BMP to monitor electrolytes.

## 2022-02-11 NOTE — Progress Notes (Signed)
Established Patient Office Visit  Subjective   Patient ID: Pamela Bowman, female    DOB: 2001-03-22  Age: 21 y.o. MRN: [id number]  Chief Complaint  Patient presents with   35mo follow up    Insurance did not cover cream for hands, possible referral for dermatologist    HPI 21 yo AA female here today for follow-up with concerns of worsening rash on hands, hirsutism, and possible referral to Dermatology. Eczema - Patient states that she has changed her laundry detergent from Tide to Gain to [employer] and is still having this rash on her hands. She states that it is peeling, oozing and itching. She denies fever, pain and redness. She states that her insurance wouldn't cover the steroid cream. She is taking zyrtec and used the CeraVe emollient cream we gave her.  PCOS/Hirsutism - Patient states that she had the ultrasound that was ordered and wanted to get the medication for her hirsutism. Patient reports LMP was last month and lasted for 1 week. Patient is not sexually active at this time, no contraception use. On metformin, but this was prescribed to her for weight loss not PCOS. Weight loss/PreDM - Patient states that she started going to a weight loss clinic last month and she goes 2/week and lost 7 lbs with diet changes. Denies excessive thirst or urination. Patient also states that she was started on Metformin mg BID.    Review of Systems  Constitutional:  Negative for chills and fever.  HENT:  Negative for sinus pain and sore throat.   Eyes:  Negative for blurred vision and double vision.  Respiratory:  Negative for cough.   Cardiovascular:  Negative for chest pain.  Gastrointestinal:  Negative for constipation, diarrhea, nausea and vomiting.  Genitourinary:  Negative for frequency and urgency.  Musculoskeletal:  Negative for myalgias.  Skin:  Positive for rash (both hands).  Neurological:  Positive for headaches (once/wk). Negative for dizziness and weakness.   Psychiatric/Behavioral:  Negative for depression and suicidal ideas.       Objective:     BP 132/86 (BP Location: Right Arm, Patient Position: Sitting, Cuff Size: Large)   Pulse (!) 103   Temp 99.4 F (37.4 C) (Oral)   Ht 5\' 1"  (1.549 m)   Wt (!) 371 lb (168.3 kg)   LMP 01/02/2022   SpO2 96%   BMI 70.10 kg/m  BP Readings from Last 3 Encounters:  02/11/22 132/86  01/26/22 122/80  01/12/22 123/77   Wt Readings from Last 3 Encounters:  02/11/22 (!) 371 lb (168.3 kg)  01/26/22 (!) 364 lb (165.1 kg)  01/12/22 (!) 371 lb (168.3 kg)      Physical Exam Cardiovascular:     Rate and Rhythm: Normal rate.     Heart sounds: Normal heart sounds.  Abdominal:     General: Bowel sounds are normal.     Tenderness: There is no abdominal tenderness.  Skin:    General: Skin is warm.  Neurological:     Mental Status: She is alert and oriented to person, place, and time.  Psychiatric:        Mood and Affect: Mood normal.        Behavior: Behavior normal.      No results found for any visits on 02/11/22.  Last CBC Lab Results  Component Value Date   WBC 6.1 12/04/2021   HGB 13.5 12/04/2021   HCT 42.0 12/04/2021   MCV 89.7 12/04/2021   MCH 29.6 08/09/2017  RDW 14.0 12/04/2021   PLT 330.0 12/04/2021   Last metabolic panel Lab Results  Component Value Date   GLUCOSE 91 12/04/2021   NA 137 12/04/2021   K 4.0 12/04/2021   CL 103 12/04/2021   CO2 27 12/04/2021   BUN 9 12/04/2021   CREATININE 0.98 12/04/2021   GFRNONAA NOT CALCULATED 10/15/2015   CALCIUM 9.5 12/04/2021   PROT 7.8 12/04/2021   ALBUMIN 4.4 12/04/2021   BILITOT 0.7 12/04/2021   ALKPHOS 46 12/04/2021   AST 32 12/04/2021   ALT 27 12/04/2021   ANIONGAP 10 10/15/2015   Last lipids Lab Results  Component Value Date   CHOL 200 12/04/2021   HDL 36.20 (L) 12/04/2021   LDLCALC 149 (H) 12/04/2021   TRIG 74.0 12/04/2021   CHOLHDL 6 12/04/2021   Last hemoglobin A1c Lab Results  Component Value Date    HGBA1C 5.8 12/04/2021   Last thyroid functions Lab Results  Component Value Date   TSH 3.41 12/04/2021   Last vitamin D No results found for: "25OHVITD2", "25OHVITD3", "VD25OH" Last vitamin B12 and Folate No results found for: "VITAMINB12", "FOLATE"    The ASCVD Risk score (Arnett DK, et al., 2019) failed to calculate for the following reasons:   The 2019 ASCVD risk score is only valid for ages 49 to 51    Assessment & Plan:   Problem List Items Addressed This Visit       Endocrine   PCOS (polycystic ovarian syndrome)    Patient to start Spirolactone 25 mg PO daily for hirsutism. Referral to Gynecology for contraceptives. Advised on taking this medication in the morning and possible side effects (dizziness, hyperkalemia, N/V/D, and electrolyte imbalance). Patient advised to come back in 7-10 days for BMP to monitor electrolytes.       Relevant Medications   spironolactone (ALDACTONE) 25 MG tablet   Other Relevant Orders   Ambulatory referral to Gynecology   Basic metabolic panel     Musculoskeletal and Integument   Hirsutism    Diagnosis of PCOS based on ultrasound and symptoms. Spirolactone 25 mg PO daily ordered today for hirsutism. Labs ordered at last visit on the high side of normal. Discussed side effects and need for BMP in 7-10 days to evaluate electrolyte function. LH - 11.8 FSH - 2.3 Testosterone level - 41      Relevant Medications   spironolactone (ALDACTONE) 25 MG tablet   Other Relevant Orders   Basic metabolic panel   Skin lesion - Primary    Eczema on bilateral hands has continued to get worse. 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 Patient requesting a referral to Dermatologist to diagnose and treat. Patient advised to continue zyrtec and OTC cortisone cream for itching.  Also educated patient on non scented soaps, detergents and lotions, avoiding hot water and keeping hands moisturized.      Relevant  Orders   Ambulatory referral to Dermatology   Ambulatory referral to Allergy     Other   Obesity    Chronic. Patient attending the weight loss clinic 2/wk for a month now and has lost 7 lbs with healthy lifestyle changes. Patient was ordered Metformin 500 mg PO BID by her weight loss provider.       Other Visit Diagnoses     Encounter for initial prescription of contraceptives, unspecified contraceptive       Relevant Orders   Ambulatory referral to Gynecology       Return in  about 6 weeks (around 03/25/2022) for F/U with Wilkin Lippy.    Elenore Paddy, NP

## 2022-02-16 ENCOUNTER — Ambulatory Visit (INDEPENDENT_AMBULATORY_CARE_PROVIDER_SITE_OTHER): Payer: Medicaid Other | Admitting: Nurse Practitioner

## 2022-02-17 ENCOUNTER — Encounter (INDEPENDENT_AMBULATORY_CARE_PROVIDER_SITE_OTHER): Payer: Self-pay | Admitting: Nurse Practitioner

## 2022-02-17 ENCOUNTER — Ambulatory Visit (INDEPENDENT_AMBULATORY_CARE_PROVIDER_SITE_OTHER): Payer: Medicaid Other | Admitting: Nurse Practitioner

## 2022-02-17 VITALS — BP 148/69 | HR 103 | Temp 99.4°F | Ht 61.0 in | Wt 365.0 lb

## 2022-02-17 DIAGNOSIS — E669 Obesity, unspecified: Secondary | ICD-10-CM

## 2022-02-17 DIAGNOSIS — R7303 Prediabetes: Secondary | ICD-10-CM | POA: Diagnosis not present

## 2022-02-17 DIAGNOSIS — Z6841 Body Mass Index (BMI) 40.0 and over, adult: Secondary | ICD-10-CM

## 2022-02-17 MED ORDER — METFORMIN HCL 500 MG PO TABS: ORAL_TABLET | ORAL | 0 refills | Status: DC

## 2022-02-22 ENCOUNTER — Other Ambulatory Visit (INDEPENDENT_AMBULATORY_CARE_PROVIDER_SITE_OTHER): Payer: Self-pay | Admitting: Family Medicine

## 2022-02-22 DIAGNOSIS — R7303 Prediabetes: Secondary | ICD-10-CM

## 2022-02-24 ENCOUNTER — Ambulatory Visit: Payer: Medicaid Other | Admitting: Allergy

## 2022-02-24 NOTE — Progress Notes (Unsigned)
Chief Complaint:   OBESITY Pamela Bowman is here to discuss her progress with her obesity treatment plan along with follow-up of her obesity related diagnoses. Pamela Bowman is on the Category 4 Plan and states she is following her eating plan approximately 50% of the time. Pamela Bowman states she is exercising 0 minutes 0 times per week.  Today's visit was #: 3 Starting weight: 371 lbs Starting date: 01/12/2022 Today's weight: 365 lbs Today's date: 02/17/2022 Total lbs lost to date: 6 lbs Total lbs lost since last in-office visit: 0  Interim History: Over the past 2 weeks Pamela Bowman has not been able to follow the meal plan as closely due to cost of food. She lives with her aunt and grandmother. She cooks for everyone in the house. She is eating 4 meals per day and 6 snacks per day. She is drinking water with flavors, Ice and soda to take her medications. Hard to eat all the food on the meal plan.  Subjective:   1. Prediabetes Fey has started Metformin 500 mg 1/2 twice a day, lunch/dinner. Not always taking with a meal. When she does not eat, she notes some GI upset. If she eats she feels better.   Assessment/Plan:   1. Prediabetes We will refill Metformin 500 mg 1/2 tablet twice a day for 1 month with 0 refills.  Side effects discussed.  To eat with a meal.    -Refill metFORMIN (GLUCOPHAGE) 500 MG tablet; 1/2 po with lunch daily and 1/2 tab with dinner  Dispense: 30 tablet; Refill: 0  2. current BMI 68.97 Pamela Bowman is currently in the action stage of change. As such, her goal is to continue with weight loss efforts. She has agreed to following a lower carbohydrate, vegetable and lean protein rich diet plan.   Pamela Bowman will plan to start BCP to discuss with PCP.  Exercise goals: All adults should avoid inactivity. Some physical activity is better than none, and adults who participate in any amount of physical activity gain some health benefits.  Behavioral modification strategies: increasing lean protein  intake, increasing water intake, and planning for success.  Pamela Bowman has agreed to follow-up with our clinic in 2 weeks. She was informed of the importance of frequent follow-up visits to maximize her success with intensive lifestyle modifications for her multiple health conditions.   Objective:   Blood pressure (!) 148/69, pulse (!) 103, temperature 99.4 F (37.4 C), height 5\' 1"  (1.549 m), weight (!) 365 lb (165.6 kg), SpO2 96 %. Body mass index is 68.97 kg/m.  General: Cooperative, alert, well developed, in no acute distress. HEENT: Conjunctivae and lids unremarkable. Cardiovascular: Regular rhythm.  Lungs: Normal work of breathing. Neurologic: No focal deficits.   Lab Results  Component Value Date   CREATININE 0.98 12/04/2021   BUN 9 12/04/2021   NA 137 12/04/2021   K 4.0 12/04/2021   CL 103 12/04/2021   CO2 27 12/04/2021   Lab Results  Component Value Date   ALT 27 12/04/2021   AST 32 12/04/2021   ALKPHOS 46 12/04/2021   BILITOT 0.7 12/04/2021   Lab Results  Component Value Date   HGBA1C 5.8 12/04/2021   HGBA1C 5.8 (A) 01/31/2020   HGBA1C 5.5 08/09/2017   HGBA1C 5.6 01/11/2017   HGBA1C 5.2 09/06/2016   No results found for: "INSULIN" Lab Results  Component Value Date   TSH 3.41 12/04/2021   Lab Results  Component Value Date   CHOL 200 12/04/2021   HDL 36.20 (L) 12/04/2021  LDLCALC 149 (H) 12/04/2021   TRIG 74.0 12/04/2021   CHOLHDL 6 12/04/2021   No results found for: "VD25OH" Lab Results  Component Value Date   WBC 6.1 12/04/2021   HGB 13.5 12/04/2021   HCT 42.0 12/04/2021   MCV 89.7 12/04/2021   PLT 330.0 12/04/2021   No results found for: "IRON", "TIBC", "FERRITIN"  Attestation Statements:   Reviewed by clinician on day of visit: allergies, medications, problem list, medical history, surgical history, family history, social history, and previous encounter notes.  I, Brendell Tyus, RMA, am acting as transcriptionist for Irene Limbo,  FNP.  I have reviewed the above documentation for accuracy and completeness, and I agree with the above. Irene Limbo, FNP

## 2022-03-03 ENCOUNTER — Ambulatory Visit (INDEPENDENT_AMBULATORY_CARE_PROVIDER_SITE_OTHER): Payer: Medicaid Other | Admitting: Bariatrics

## 2022-03-04 ENCOUNTER — Other Ambulatory Visit (INDEPENDENT_AMBULATORY_CARE_PROVIDER_SITE_OTHER): Payer: Medicaid Other

## 2022-03-04 DIAGNOSIS — E282 Polycystic ovarian syndrome: Secondary | ICD-10-CM

## 2022-03-04 DIAGNOSIS — L680 Hirsutism: Secondary | ICD-10-CM | POA: Diagnosis not present

## 2022-03-04 LAB — BASIC METABOLIC PANEL
BUN: 15 mg/dL (ref 6–23)
CO2: 23 mEq/L (ref 19–32)
Calcium: 9.2 mg/dL (ref 8.4–10.5)
Chloride: 106 mEq/L (ref 96–112)
Creatinine, Ser: 0.87 mg/dL (ref 0.40–1.20)
GFR: 94.59 mL/min (ref >60.00)
Glucose, Bld: 94 mg/dL (ref 70–99)
Potassium: 3.9 mEq/L (ref 3.5–5.1)
Sodium: 139 mEq/L (ref 135–145)

## 2022-03-10 ENCOUNTER — Ambulatory Visit (INDEPENDENT_AMBULATORY_CARE_PROVIDER_SITE_OTHER): Payer: Medicaid Other | Admitting: Nurse Practitioner

## 2022-03-10 ENCOUNTER — Other Ambulatory Visit: Payer: Self-pay | Admitting: Nurse Practitioner

## 2022-03-10 ENCOUNTER — Encounter (INDEPENDENT_AMBULATORY_CARE_PROVIDER_SITE_OTHER): Payer: Self-pay | Admitting: Nurse Practitioner

## 2022-03-10 VITALS — BP 107/74 | HR 85 | Temp 98.4°F | Ht 61.0 in | Wt 363.0 lb

## 2022-03-10 DIAGNOSIS — E282 Polycystic ovarian syndrome: Secondary | ICD-10-CM

## 2022-03-10 DIAGNOSIS — Z6841 Body Mass Index (BMI) 40.0 and over, adult: Secondary | ICD-10-CM

## 2022-03-10 DIAGNOSIS — E669 Obesity, unspecified: Secondary | ICD-10-CM | POA: Diagnosis not present

## 2022-03-10 DIAGNOSIS — R7303 Prediabetes: Secondary | ICD-10-CM

## 2022-03-10 DIAGNOSIS — L680 Hirsutism: Secondary | ICD-10-CM

## 2022-03-10 MED ORDER — METFORMIN HCL 500 MG PO TABS: ORAL_TABLET | ORAL | 0 refills | Status: DC

## 2022-03-15 NOTE — Progress Notes (Unsigned)
Chief Complaint:   OBESITY Pamela Bowman is here to discuss her progress with her obesity treatment plan along with follow-up of her obesity related diagnoses. Pamela Bowman is on following a lower carbohydrate, vegetable and lean protein rich diet plan and states she is following her eating plan approximately 50% of the time. Pamela Bowman states she is exercise 0 minutes 0 times per week.  Today's visit was #: 4 Starting weight: 371 lbs Starting date: 01/12/2022 Today's weight: 363 lbs Today's date: 03/10/2022 Total lbs lost to date: 8 lbs Total lbs lost since last in-office visit: 2  Interim History: Pamela Bowman reports she is doing great. She has been more active since her last visit. She is eating 3 meals and 4 snacks daily. Snacks: granola, cinnamon applesauce, granola bars, fruit, rarely chips. Drinking Sparkling water and soda to take medications. Likes LCP better then Cat 4 plan. Denies polyphagia.  Subjective:   1. Prediabetes Pamela Bowman is taking Metformin 500 mg 1/2 tablet twice a day. Some side effects of diarrhea. Denies polyphagia and cravings.  Assessment/Plan:   1. Prediabetes We will refill Metformin 500 mg 1/2  tablet twice a day for 1 month with 0 refills.  Pamela Bowman will continue to work on weight loss, exercise, and decreasing simple carbohydrates to help decrease the risk of diabetes.    -Refill metFORMIN (GLUCOPHAGE) 500 MG tablet; 1/2 po with lunch daily and 1/2 tab with dinner  Dispense: 30 tablet; Refill: 0  2. Obesity with the current BMI of 68.6 Pamela Bowman is currently in the action stage of change. As such, her goal is to continue with weight loss efforts. She has agreed to following a lower carbohydrate, vegetable and lean protein rich diet plan.   Exercise goals: All adults should avoid inactivity. Some physical activity is better than none, and adults who participate in any amount of physical activity gain some health benefits.  Behavioral modification strategies: increasing lean protein  intake, increasing vegetables, and increasing water intake.  Pamela Bowman has agreed to follow-up with our clinic in 3 weeks. She was informed of the importance of frequent follow-up visits to maximize her success with intensive lifestyle modifications for her multiple health conditions.   Objective:   Blood pressure 107/74, pulse 85, temperature 98.4 F (36.9 C), height 5\' 1"  (1.549 m), weight (!) 363 lb (164.7 kg), SpO2 98 %. Body mass index is 68.59 kg/m.  General: Cooperative, alert, well developed, in no acute distress. HEENT: Conjunctivae and lids unremarkable. Cardiovascular: Regular rhythm.  Lungs: Normal work of breathing. Neurologic: No focal deficits.   Lab Results  Component Value Date   CREATININE 0.87 03/04/2022   BUN 15 03/04/2022   NA 139 03/04/2022   K 3.9 03/04/2022   CL 106 03/04/2022   CO2 23 03/04/2022   Lab Results  Component Value Date   ALT 27 12/04/2021   AST 32 12/04/2021   ALKPHOS 46 12/04/2021   BILITOT 0.7 12/04/2021   Lab Results  Component Value Date   HGBA1C 5.8 12/04/2021   HGBA1C 5.8 (A) 01/31/2020   HGBA1C 5.5 08/09/2017   HGBA1C 5.6 01/11/2017   HGBA1C 5.2 09/06/2016   No results found for: "INSULIN" Lab Results  Component Value Date   TSH 3.41 12/04/2021   Lab Results  Component Value Date   CHOL 200 12/04/2021   HDL 36.20 (L) 12/04/2021   LDLCALC 149 (H) 12/04/2021   TRIG 74.0 12/04/2021   CHOLHDL 6 12/04/2021   No results found for: "VD25OH" Lab Results  Component Value Date   WBC 6.1 12/04/2021   HGB 13.5 12/04/2021   HCT 42.0 12/04/2021   MCV 89.7 12/04/2021   PLT 330.0 12/04/2021   No results found for: "IRON", "TIBC", "FERRITIN"  Attestation Statements:   Reviewed by clinician on day of visit: allergies, medications, problem list, medical history, surgical history, family history, social history, and previous encounter notes.  I, Brendell Tyus, RMA, am acting as transcriptionist for Irene Limbo,  FNP.  I have reviewed the above documentation for accuracy and completeness, and I agree with the above. Irene Limbo, FNP

## 2022-03-25 ENCOUNTER — Ambulatory Visit: Payer: Medicaid Other | Admitting: Nurse Practitioner

## 2022-03-26 ENCOUNTER — Ambulatory Visit: Payer: Medicaid Other | Admitting: Nurse Practitioner

## 2022-03-29 ENCOUNTER — Ambulatory Visit: Payer: Medicaid Other | Admitting: Nurse Practitioner

## 2022-04-01 ENCOUNTER — Ambulatory Visit: Payer: Medicaid Other | Admitting: Nurse Practitioner

## 2022-04-01 VITALS — BP 110/68 | HR 83 | Temp 98.3°F | Ht 61.0 in | Wt 375.3 lb

## 2022-04-01 DIAGNOSIS — R7303 Prediabetes: Secondary | ICD-10-CM

## 2022-04-01 DIAGNOSIS — Z3202 Encounter for pregnancy test, result negative: Secondary | ICD-10-CM

## 2022-04-01 DIAGNOSIS — L309 Dermatitis, unspecified: Secondary | ICD-10-CM

## 2022-04-01 DIAGNOSIS — E282 Polycystic ovarian syndrome: Secondary | ICD-10-CM | POA: Diagnosis not present

## 2022-04-01 DIAGNOSIS — Z6841 Body Mass Index (BMI) 40.0 and over, adult: Secondary | ICD-10-CM

## 2022-04-01 LAB — POCT URINE PREGNANCY: Preg Test, Ur: NEGATIVE

## 2022-04-01 NOTE — Assessment & Plan Note (Signed)
Per chart review it appears that she was referred to [facility] dermatology office, have provided patient with their phone number so she can call to discuss being scheduled for further evaluation and management of her eczema.

## 2022-04-01 NOTE — Patient Instructions (Signed)
[phone]: Phone number to dermatologist. [facility].

## 2022-04-01 NOTE — Assessment & Plan Note (Signed)
Chronic, recommend patient follow-up with gynecology for further assistance with evaluation and management.  Patient reports she is about to run out of spironolactone, recommend she hold off on refilling until she is on birth control.  Patient reports understanding.  Follow-up in 4 months to determine if we need to restart spironolactone.

## 2022-04-01 NOTE — Progress Notes (Signed)
Established Patient Office Visit  Subjective   Patient ID: Pamela Bowman, female    DOB: [birth year]  Age: 21 y.o. MRN: [id number]  Chief Complaint  Patient presents with   pcos    Patient has a follow-up with the above.  PCOS: On spironolactone 25 mg a mouth daily.  Scheduled for OB/GYN visit next month to discuss starting on contraception.  Patient denies being sexually active currently.  She reports spironolactone has helped with the hirsutism however she thinks she may need a higher dose as hirsutism is not completely resolved.  Eczema: Has tried and failed emollient, over-the-counter hydrocortisone, triamcinolone, and betamethasone (betamethasone was too expensive so was never actually tried).  Referred to dermatology, has not heard from their office.  Prediabetes/obesity: Working with weight loss provider, has increased her physical activity.  Per chart review has had a 10 pound weight gain over the last 6 weeks.  Last A1c 5.8.    Review of Systems  Respiratory:  Negative for shortness of breath.   Cardiovascular:  Negative for chest pain.      Objective:     BP 110/68   Pulse 83   Temp 98.3 F (36.8 C) (Oral)   Ht 5\' 1"  (1.549 m)   Wt (!) 375 lb 5 oz (170.2 kg)   SpO2 98%   BMI 70.91 kg/m  BP Readings from Last 3 Encounters:  04/01/22 110/68  03/10/22 107/74  02/17/22 (!) 148/69   Wt Readings from Last 3 Encounters:  04/01/22 (!) 375 lb 5 oz (170.2 kg)  03/10/22 (!) 363 lb (164.7 kg)  02/17/22 (!) 365 lb (165.6 kg)      Physical Exam Vitals reviewed.  Constitutional:      General: She is not in acute distress.    Appearance: Normal appearance.  HENT:     Head: Normocephalic and atraumatic.  Neck:     Vascular: No carotid bruit.  Cardiovascular:     Rate and Rhythm: Normal rate and regular rhythm.     Pulses: Normal pulses.     Heart sounds: Normal heart sounds.  Pulmonary:     Effort: Pulmonary effort is normal.     Breath sounds: Normal breath  sounds.  Skin:    General: Skin is warm and dry.  Neurological:     General: No focal deficit present.     Mental Status: She is alert and oriented to person, place, and time.  Psychiatric:        Mood and Affect: Mood normal.        Behavior: Behavior normal.        Judgment: Judgment normal.      Results for orders placed or performed in visit on 04/01/22  POCT urine pregnancy  Result Value Ref Range   Preg Test, Ur Negative Negative      The ASCVD Risk score (Arnett DK, et al., 2019) failed to calculate for the following reasons:   The 2019 ASCVD risk score is only valid for ages 51 to 78    Assessment & Plan:   Problem List Items Addressed This Visit       Endocrine   PCOS (polycystic ovarian syndrome) - Primary    Chronic, recommend patient follow-up with gynecology for further assistance with evaluation and management.  Patient reports she is about to run out of spironolactone, recommend she hold off on refilling until she is on birth control.  Patient reports understanding.  Follow-up in 4 months to determine if we  need to restart spironolactone.        Musculoskeletal and Integument   Eczema    Per chart review it appears that she was referred to [facility] dermatology office, have provided patient with their phone number so she can call to discuss being scheduled for further evaluation and management of her eczema.        Other   Pre-diabetes    Chronic, encouraged her to continue focusing lifestyle management to follow-up with her weight loss provider.      Obesity    Chronic, encouraged her to continue focusing lifestyle management to follow-up with her weight loss provider.      Other Visit Diagnoses     Pregnancy examination or test, negative result       Relevant Orders   POCT urine pregnancy (Completed)       Return in about 4 months (around 08/01/2022) for F/u with Edye Hainline.    Ailene Ards, NP

## 2022-04-01 NOTE — Assessment & Plan Note (Signed)
Chronic, encouraged her to continue focusing lifestyle management to follow-up with her weight loss provider.

## 2022-04-01 NOTE — Assessment & Plan Note (Signed)
Chronic, encouraged her to continue focusing lifestyle management to follow-up with her weight loss provider. 

## 2022-04-08 NOTE — Progress Notes (Signed)
New Patient Note  RE: Pamela Bowman MRN: [id number] DOB: [birth year] Date of Office Visit: 04/09/2022  Consult requested by: Elenore Paddy, NP Primary care provider: Elenore Paddy, NP  Chief Complaint: Rash  History of Present Illness: I had the pleasure of seeing Pamela Bowman for initial evaluation at the [facility] on 04/10/2022. She is a 21 y.o. female, who is referred here by Elenore Paddy, NP for the evaluation of rash.  Rash started about 10 months ago. Mainly occurs on her hands, neck. Describes them as itchy, dry, vesicles, peelings. Individual rashes lasts about 3 days. Associated symptoms include: none.  Suspected triggers are unknown. Denies any fevers, chills, changes in medications, foods, personal care products or recent infections. She has tried the following therapies: OTC hydrocortisone with some benefit. Systemic steroids no. Currently on no daily meds.  Previous work up includes: no. Previous history of rash/hives: eczema as a child on her back and face. Patient is up to date with the following cancer screening tests: physical exam, pap smear.  Tried to change some personal care products with no benefit.  Using shea butter, baby oil gel. Using perfumes.   02/11/2022 PCP visit: "21 yo AA female here today for follow-up with concerns of worsening rash on hands, hirsutism, and possible referral to Dermatology. Eczema - Patient states that she has changed her laundry detergent from Tide to Gain to [employer] and is still having this rash on her hands. She states that it is peeling, oozing and itching. She denies fever, pain and redness. She states that her insurance wouldn't cover the steroid cream. She is taking zyrtec and used the CeraVe emollient cream we gave her.  Eczema on bilateral hands has continued to get worse. 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 Patient requesting a referral to Dermatologist to  diagnose and treat. Patient advised to continue zyrtec and OTC cortisone cream for itching.  Also educated patient on non scented soaps, detergents and lotions, avoiding hot water and keeping hands moisturized."  Assessment and Plan: Pamela Bowman is a 21 y.o. 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 History of eczema as a child. Using OTC hydrocortisone with some benefit. See below for proper skin care. Use fragrance free and dye free products. Wear gloves when cleaning/cooking/doing dishes. Use Eucrisa (crisaborole) 2% ointment twice a day on mild rash flares on the face and body. This is a non-steroid ointment. Samples given. Use triamcinolone 0.1% ointment twice a day as needed for rash flares. Do not use on the face, neck, armpits or groin area. Do not use more than 3 weeks in a row.  Consider starting Dupixent - handout given.   Other allergic rhinitis Some symptoms mainly around cats. Today's skin testing showed: Positive to trees and grass. Start environmental control measures as below. Use over the counter antihistamines such as Zyrtec (cetirizine), Claritin (loratadine), Allegra (fexofenadine), or Xyzal (levocetirizine) daily as needed. May take twice a day during allergy flares. May switch antihistamines every few months.  Other adverse food reactions, not elsewhere classified, subsequent encounter One episode of throat tightness after drinking sunny-D. Tolerates other juices and oranges with no issues. Continue to avoid Sunny-D for now.  Mild intermittent asthma without complication Has asthma flare a few times per year and uses albuterol prn with good benefit. No prednisone use. Today's spirometry showed restriction - most likely due to body habitus. May use  albuterol rescue inhaler 2 puffs every 4 to 6 hours as needed for shortness of breath, chest tightness, coughing, and wheezing. Monitor frequency of use.   Return in about 4 weeks (around  05/07/2022).  Meds ordered this encounter  Medications   Crisaborole (EUCRISA) 2 % OINT    Sig: Apply 1 Application topically 2 (two) times daily as needed (mild rash).    Dispense:  60 g    Refill:  5   triamcinolone ointment (KENALOG) 0.1 %    Sig: Apply 1 Application topically 2 (two) times daily as needed (rash flare). Do not use on the face, neck, armpits or groin area. Do not use more than 3 weeks in a row.    Dispense:  30 g    Refill:  0   Lab Orders  No laboratory test(s) ordered today    Other allergy screening: Asthma: yes Uses albuterol every 3 months during asthma attacks with good benefit.  Usually gets wheezing, chest tightness. No recent prednisone use.  Rhino conjunctivitis:  certain cats cause some itchy eyes. 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 No issues with other juices, oranges with no issues.  Medication allergy: no Hymenoptera allergy: no History of recurrent infections suggestive of immunodeficency: no  Diagnostics: Spirometry:  Tracings reviewed. Her effort: Good reproducible efforts. FVC: 2.33L FEV1: 2.24L, 85% predicted FEV1/FVC ratio: 96% Interpretation: Spirometry consistent with possible restrictive disease.  Please see scanned spirometry results for details.  Skin Testing: Environmental allergy panel and select foods. Positive to trees and grass. Negative to common foods. Results discussed with patient/family.  Airborne Adult Perc - 04/09/22 1018     Time Antigen Placed 1023    Allergen Manufacturer Waynette ButteryGreer    Location Back    Number of Test 59    1. Control-Buffer 50% Glycerol Negative    2. Control-Histamine 1 mg/ml 2+    3. Albumin saline Negative    4. Bahia Negative    5. French Southern Territories[place]ermuda Negative    6. Johnson 2+    7. Kentucky Blue 2+    8. Meadow Fescue Negative    9. Perennial Rye Negative   +/-   10. Sweet Vernal Negative    11. Timothy Negative    12. Cocklebur Negative    13. Burweed Marshelder Negative     14. Ragweed, short Negative    15. Ragweed, Giant Negative    16. Plantain,  English Negative    17. Lamb's Quarters Negative    18. Sheep Sorrell Negative    19. Rough Pigweed Negative    20. Marsh Elder, Rough Negative    21. Mugwort, Common Negative    22. Ash mix Negative    23. Birch mix Negative    24. Beech American Negative    25. Box, Elder Negative    26. Cedar, red Negative    27. Cottonwood, Guinea-BissauEastern Negative    28. Elm mix Negative    29. Hickory Negative    30. Maple mix Negative    31. Oak, Guinea-Bissau[place]astern mix Negative    32. Pecan Pollen Negative    33. Pine mix 2+    34. Sycamore Eastern Negative    35. Walnut, Black Pollen 2+    36. Alternaria alternata Negative    37. Cladosporium Herbarum Negative    38. Aspergillus mix Negative    39. Penicillium mix Negative    40. Bipolaris sorokiniana (Helminthosporium) Negative    41. Drechslera spicifera (Curvularia)  Negative    42. Mucor plumbeus Negative    43. Fusarium moniliforme Negative    44. Aureobasidium pullulans (pullulara) Negative    45. Rhizopus oryzae Negative    46. Botrytis cinera Negative    47. Epicoccum nigrum Negative    48. Phoma betae Negative    49. Candida Albicans Negative    50. Trichophyton mentagrophytes Negative    51. Mite, D Farinae  5,000 AU/ml Negative    52. Mite, D Pteronyssinus  5,000 AU/ml Negative    53. Cat Hair 10,000 BAU/ml Negative    54.  Dog Epithelia Negative    55. Mixed Feathers Negative    56. Horse Epithelia Negative    57. Cockroach, German Negative    58. Mouse Negative    59. Tobacco Leaf Negative             Food Perc - 04/09/22 1019       Test Information   Time Antigen Placed 1024    Allergen Manufacturer Waynette Buttery    Location Back    Number of allergen test 10      Food   1. Peanut Negative    2. Soybean food Negative    3. Wheat, whole Negative    4. Sesame Negative    5. Milk, cow Negative    6. Egg White, chicken Negative    7. Casein  Negative    8. Shellfish mix Negative    9. Fish mix Negative    10. Cashew Negative             Past Medical History: Patient Active Problem List   Diagnosis Date Noted   Dyshidrotic eczema 04/10/2022   Other allergic rhinitis 04/10/2022   Other adverse food reactions, not elsewhere classified, subsequent encounter 04/10/2022   Skin lesion 02/11/2022   PCOS (polycystic ovarian syndrome) 02/11/2022   Anxiety and depression 01/08/2022   Mild intermittent asthma without complication 01/08/2022   Eczema 01/08/2022   Hirsutism 01/08/2022   Chronic migraine w/o aura w/o status migrainosus, not intractable 06/25/2020   Pseudotumor cerebri 03/02/2018   Depression 03/02/2018   Chronic migraine 03/02/2018   Left shoulder pain 09/22/2017   Eye swelling, left 08/29/2017   Intracranial hypertension 08/29/2017   Leg length difference, acquired 08/11/2017   Abnormality of gait 08/11/2017   Morbid childhood obesity with BMI greater than 99th percentile for age [facility]) 11/19/2015   Adjustment disorder with mixed anxiety and depressed mood 08/10/2015   Snoring 08/10/2015   Hyperlipidemia 07/11/2015   Pre-diabetes 12/18/2010   Mixed hyperlipidemia 12/18/2010   Thyroiditis, unspecified 12/18/2010   Obesity 12/18/2010   Past Medical History:  Diagnosis Date   Anxiety    Asthma    Depression    Diabetes mellitus without complication (HCC)    says it is pre-diabetes   Eczema    Ingrown toenail    Optic nerve edema    Prediabetes    Seasonal allergies    Past Surgical History: Past Surgical History:  Procedure Laterality Date   TONSILLECTOMY AND ADENOIDECTOMY Bilateral 10/15/2015   Procedure: TONSILLECTOMY AND ADENOIDECTOMY;  Surgeon: Newman Pies, MD;  Location: [facility] OR;  Service: ENT;  Laterality: Bilateral;   WISDOM TOOTH EXTRACTION     Medication List:  Current Outpatient Medications  Medication Sig Dispense Refill   albuterol (PROVENTIL) (2.5 MG/3ML) 0.083% nebulizer solution Take  3 mLs (2.5 mg total) by nebulization every 6 (six) hours as needed for wheezing or shortness of breath. 150 mL  1   albuterol (VENTOLIN HFA) 108 (90 Base) MCG/ACT inhaler Inhale 1-2 puffs into the lungs every 6 (six) hours as needed for wheezing or shortness of breath. 1 each 6   betamethasone dipropionate 0.05 % cream Apply topically 2 (two) times daily. 30 g 0   cetirizine (ZYRTEC) 10 MG tablet Take 10 mg by mouth daily.     Crisaborole (EUCRISA) 2 % OINT Apply 1 Application topically 2 (two) times daily as needed (mild rash). 60 g 5   metFORMIN (GLUCOPHAGE) 500 MG tablet 1/2 po with lunch daily and 1/2 tab with dinner 30 tablet 0   sertraline (ZOLOFT) 100 MG tablet Take 1 tablet (100 mg total) by mouth daily. 30 tablet 3   Spacer/Aero-Holding Chambers (AEROCHAMBER PLUS) inhaler Use as instructed 1 each 2   topiramate (TOPAMAX) 100 MG tablet Take 2 tablets (200 mg total) by mouth at bedtime. 180 tablet 4   triamcinolone ointment (KENALOG) 0.1 % Apply 1 Application topically 2 (two) times daily as needed (rash flare). Do not use on the face, neck, armpits or groin area. Do not use more than 3 weeks in a row. 30 g 0   No current facility-administered medications for this visit.   Allergies: No Known Allergies Social History: Social History   Socioeconomic History   Marital status: Single    Spouse name: Not on file   Number of children: 0   Years of education: high school   Highest education level: Not on file  Occupational History   Occupation: Student  Tobacco Use   Smoking status: Never    Passive exposure: Current   Smokeless tobacco: Never  Vaping Use   Vaping Use: Never used  Substance and Sexual Activity   Alcohol use: No   Drug use: No   Sexual activity: Not on file  Other Topics Concern   Not on file  Social History Narrative   12th grade at [employer].   Right-handed.   Lives at home with father and grandmother.   Some day use of caffeine.   Social  Determinants of Health   Financial Resource Strain: Not on file  Food Insecurity: Not on file  Transportation Needs: Not on file  Physical Activity: Not on file  Stress: Not on file  Social Connections: Not on file   Lives in a 23 year old house. Smoking: denies Occupation: Press photographer HistorySurveyor, minerals in the house: no Engineer, civil (consulting) in the family room: yes Carpet in the bedroom: yes Heating: gas Cooling: central Pet: no  Family History: Family History  Problem Relation Age of Onset   Depression Mother    Anxiety disorder Mother    Obesity Mother    Breast cancer Maternal Grandmother    Obesity Maternal Grandmother    Obesity Paternal Grandmother    Cancer Paternal Grandfather        unsure of type   Asthma Paternal Aunt    Diabetes Other    Hypertension Other    Review of Systems  Constitutional:  Negative for appetite change, chills, fever and unexpected weight change.  HENT:  Negative for congestion and rhinorrhea.   Eyes:  Negative for itching.  Respiratory:  Negative for cough, chest tightness, shortness of breath and wheezing.   Cardiovascular:  Negative for chest pain.  Gastrointestinal:  Negative for abdominal pain.  Genitourinary:  Negative for difficulty urinating.  Skin:  Positive for rash.  Allergic/Immunologic: Positive for environmental allergies. Negative for food allergies.  Neurological:  Negative for headaches.    Objective: BP 122/74   Pulse 98   Temp 98 F (36.7 C) (Temporal)   Resp 18   Ht 5' 1.5" (1.562 m)   Wt (!) 374 lb 9.6 oz (169.9 kg)   SpO2 96%   BMI 69.63 kg/m  Body mass index is 69.63 kg/m. Physical Exam Vitals and nursing note reviewed.  Constitutional:      Appearance: Normal appearance. She is well-developed. She is obese.  HENT:     Head: Normocephalic and atraumatic.     Right Ear: Tympanic membrane and external ear normal.     Left Ear: Tympanic membrane and external ear normal.     Nose: Nose  normal.     Mouth/Throat:     Mouth: Mucous membranes are moist.     Pharynx: Oropharynx is clear.  Eyes:     Conjunctiva/sclera: Conjunctivae normal.  Cardiovascular:     Rate and Rhythm: Normal rate and regular rhythm.     Heart sounds: Normal heart sounds. No murmur heard.    No friction rub. No gallop.  Pulmonary:     Effort: Pulmonary effort is normal.     Breath sounds: Normal breath sounds. No wheezing, rhonchi or rales.  Musculoskeletal:     Cervical back: Neck supple.  Skin:    General: Skin is warm.     Findings: Rash present.     Comments: Dry, fissured skin on the hands b/l.  Neurological:     Mental Status: She is alert and oriented to person, place, and time.  Psychiatric:        Behavior: Behavior normal.   The plan was reviewed with the patient/family, and all questions/concerned were addressed.  It was my pleasure to see Pamela Bowman today and participate in her care. Please feel free to contact me with any questions or concerns.  Sincerely,  Rexene Alberts, DO Allergy & Immunology  [facility] office: [place] office: [phone]

## 2022-04-09 ENCOUNTER — Other Ambulatory Visit: Payer: Self-pay

## 2022-04-09 ENCOUNTER — Ambulatory Visit: Payer: Medicaid Other | Admitting: Allergy

## 2022-04-09 ENCOUNTER — Encounter: Payer: Self-pay | Admitting: Allergy

## 2022-04-09 VITALS — BP 122/74 | HR 98 | Temp 98.0°F | Resp 18 | Ht 61.5 in | Wt 374.6 lb

## 2022-04-09 DIAGNOSIS — L2089 Other atopic dermatitis: Secondary | ICD-10-CM

## 2022-04-09 DIAGNOSIS — K9049 Malabsorption due to intolerance, not elsewhere classified: Secondary | ICD-10-CM

## 2022-04-09 DIAGNOSIS — T781XXD Other adverse food reactions, not elsewhere classified, subsequent encounter: Secondary | ICD-10-CM

## 2022-04-09 DIAGNOSIS — L301 Dyshidrosis [pompholyx]: Secondary | ICD-10-CM | POA: Diagnosis not present

## 2022-04-09 DIAGNOSIS — J4520 Mild intermittent asthma, uncomplicated: Secondary | ICD-10-CM | POA: Diagnosis not present

## 2022-04-09 DIAGNOSIS — J3089 Other allergic rhinitis: Secondary | ICD-10-CM

## 2022-04-09 MED ORDER — TRIAMCINOLONE ACETONIDE 0.1 % EX OINT: 1.0000 | TOPICAL_OINTMENT | Freq: Two times a day (BID) | CUTANEOUS | 0 refills | Status: DC | PRN

## 2022-04-09 MED ORDER — EUCRISA 2 % EX OINT: 1.0000 | TOPICAL_OINTMENT | Freq: Two times a day (BID) | CUTANEOUS | 5 refills | Status: DC | PRN

## 2022-04-09 NOTE — Patient Instructions (Addendum)
Today's skin testing showed: Positive to trees and grass. Negative to common foods.  Results given.  Eczema/dyshidrotic eczema See below for proper skin care. Use fragrance free and dye free products. Wear gloves when cleaning/cooking/doing dishes. Use Eucrisa (crisaborole) 2% ointment twice a day on mild rash flares on the face and body. This is a non-steroid ointment. Samples given. If it burns, place the medication in the refrigerator.  Apply a thin layer of moisturizer and then apply the Eucrisa on top of it. Use triamcinolone 0.1% ointment twice a day as needed for rash flares. Do not use on the face, neck, armpits or groin area. Do not use more than 3 weeks in a row.  Read about Dupixent injections  Environmental allergies Start environmental control measures as below. Use over the counter antihistamines such as Zyrtec (cetirizine), Claritin (loratadine), Allegra (fexofenadine), or Xyzal (levocetirizine) daily as needed. May take twice a day during allergy flares. May switch antihistamines every few months.  Food Continue to avoid Sunny-D  Breathing May use albuterol rescue inhaler 2 puffs every 4 to 6 hours as needed for shortness of breath, chest tightness, coughing, and wheezing. Monitor frequency of use.    Follow up in 1 months or sooner if needed.      Skin care recommendations  Bath time: Always use lukewarm water. AVOID very hot or cold water. Keep bathing time to 5-10 minutes. Do NOT use bubble bath. Use a mild soap and use just enough to wash the dirty areas. Do NOT scrub skin vigorously.  After bathing, pat dry your skin with a towel. Do NOT rub or scrub the skin.  Moisturizers and prescriptions:  ALWAYS apply moisturizers immediately after bathing (within 3 minutes). This helps to lock-in moisture. Use the moisturizer several times a day over the whole body. Good summer moisturizers include: Aveeno, CeraVe, Cetaphil. Good winter moisturizers include:  Aquaphor, Vaseline, Cerave, Cetaphil, Eucerin, Vanicream. When using moisturizers along with medications, the moisturizer should be applied about one hour after applying the medication to prevent diluting effect of the medication or moisturize around where you applied the medications. When not using medications, the moisturizer can be continued twice daily as maintenance.  Laundry and clothing: Avoid laundry products with added color or perfumes. Use unscented hypo-allergenic laundry products such as Tide free, Cheer free & gentle, and All free and clear.  If the skin still seems dry or sensitive, you can try double-rinsing the clothes. Avoid tight or scratchy clothing such as wool. Do not use fabric softeners or dyer sheets.   Reducing Pollen Exposure Pollen seasons: trees (spring), grass (summer) and ragweed/weeds (fall). Keep windows closed in your home and car to lower pollen exposure.  Install air conditioning in the bedroom and throughout the house if possible.  Avoid going out in dry windy days - especially early morning. Pollen counts are highest between 5 - 10 AM and on dry, hot and windy days.  Save outside activities for late afternoon or after a heavy rain, when pollen levels are lower.  Avoid mowing of grass if you have grass pollen allergy. Be aware that pollen can also be transported indoors on people and pets.  Dry your clothes in an automatic dryer rather than hanging them outside where they might collect pollen.  Rinse hair and eyes before bedtime.

## 2022-04-10 ENCOUNTER — Encounter: Payer: Self-pay | Admitting: Allergy

## 2022-04-10 DIAGNOSIS — L301 Dyshidrosis [pompholyx]: Secondary | ICD-10-CM | POA: Insufficient documentation

## 2022-04-10 DIAGNOSIS — J3089 Other allergic rhinitis: Secondary | ICD-10-CM | POA: Insufficient documentation

## 2022-04-10 DIAGNOSIS — T781XXD Other adverse food reactions, not elsewhere classified, subsequent encounter: Secondary | ICD-10-CM | POA: Insufficient documentation

## 2022-04-10 NOTE — Assessment & Plan Note (Signed)
One episode of throat tightness after drinking sunny-D. Tolerates other juices and oranges with no issues. . Continue to avoid Sunny-D for now.

## 2022-04-10 NOTE — Assessment & Plan Note (Signed)
Started 10 months ago mainly on her hands. History of eczema as a child. Using OTC hydrocortisone with some benefit. . See below for proper skin care. . Use fragrance free and dye free products. . Wear gloves when cleaning/cooking/doing dishes. . Use Eucrisa (crisaborole) 2% ointment twice a day on mild rash flares on the face and body. This is a non-steroid ointment. Samples given. . Use triamcinolone 0.1% ointment twice a day as needed for rash flares. Do not use on the face, neck, armpits or groin area. Do not use more than 3 weeks in a row.  . Consider starting Dupixent - handout given.

## 2022-04-10 NOTE — Assessment & Plan Note (Signed)
>>  ASSESSMENT AND PLAN FOR DYSHIDROTIC ECZEMA WRITTEN ON 04/10/2022 12:03 PM BY Trudy Fusi, DO  Started 10 months ago mainly on her hands. History of eczema as a child. Using OTC hydrocortisone with some benefit. See below for proper skin care. Use fragrance free and dye free products. Wear gloves when cleaning/cooking/doing dishes. Use Eucrisa  (crisaborole ) 2% ointment twice a day on mild rash flares on the face and body. This is a non-steroid ointment. Samples given. Use triamcinolone  0.1% ointment twice a day as needed for rash flares. Do not use on the face, neck, armpits or groin area. Do not use more than 3 weeks in a row.  Consider starting Dupixent - handout given.

## 2022-04-10 NOTE — Assessment & Plan Note (Signed)
Has asthma flare a few times per year and uses albuterol prn with good benefit. No prednisone use.  Today's spirometry showed restriction - most likely due to body habitus. . May use albuterol rescue inhaler 2 puffs every 4 to 6 hours as needed for shortness of breath, chest tightness, coughing, and wheezing. Monitor frequency of use.

## 2022-04-10 NOTE — Assessment & Plan Note (Signed)
Some symptoms mainly around cats.  Today's skin testing showed: Positive to trees and grass.  Start environmental control measures as below.  Use over the counter antihistamines such as Zyrtec (cetirizine), Claritin (loratadine), Allegra (fexofenadine), or Xyzal (levocetirizine) daily as needed. May take twice a day during allergy flares. May switch antihistamines every few months.

## 2022-04-30 ENCOUNTER — Ambulatory Visit: Payer: Medicaid Other | Admitting: Student

## 2022-05-09 NOTE — Progress Notes (Deleted)
Follow Up Note  RE: Pamela Bowman MRN: [id number] DOB: Jun 15, 2001 Date of Office Visit: 05/10/2022  Referring provider: Ailene Ards, NP Primary care provider: Ailene Ards, NP  Chief Complaint: No chief complaint on file.  History of Present Illness: I had the pleasure of seeing Pamela Bowman for a follow up visit at the [facility] on 05/09/2022. She is a 21 y.o. female, who is being followed for dyshidrotic eczema, allergic rhinitis, adverse food reaction, asthma. Her previous allergy office visit was on 04/09/2022 with Dr. Maudie Mercury. Today is a regular follow up visit.  Dyshidrotic eczema Started 10 months ago mainly on her hands. History of eczema as a child. Using OTC hydrocortisone with some benefit. See below for proper skin care. Use fragrance free and dye free products. Wear gloves when cleaning/cooking/doing dishes. Use Eucrisa (crisaborole) 2% ointment twice a day on mild rash flares on the face and body. This is a non-steroid ointment. Samples given. Use triamcinolone 0.1% ointment twice a day as needed for rash flares. Do not use on the face, neck, armpits or groin area. Do not use more than 3 weeks in a row.  Consider starting Dupixent - handout given.    Other allergic rhinitis Some symptoms mainly around cats. Today's skin testing showed: Positive to trees and grass. Start environmental control measures as below. Use over the counter antihistamines such as Zyrtec (cetirizine), Claritin (loratadine), Allegra (fexofenadine), or Xyzal (levocetirizine) daily as needed. May take twice a day during allergy flares. May switch antihistamines every few months.   Other adverse food reactions, not elsewhere classified, subsequent encounter One episode of throat tightness after drinking sunny-D. Tolerates other juices and oranges with no issues. Continue to avoid Sunny-D for now.   Mild intermittent asthma without complication Has asthma flare a few times per year and  uses albuterol prn with good benefit. No prednisone use. Today's spirometry showed restriction - most likely due to body habitus. May use albuterol rescue inhaler 2 puffs every 4 to 6 hours as needed for shortness of breath, chest tightness, coughing, and wheezing. Monitor frequency of use.    Return in about 4 weeks (around 05/07/2022).  Assessment and Plan: [name] is a 21 y.o. female with: No problem-specific Assessment & Plan notes found for this encounter.  No follow-ups on file.  No orders of the defined types were placed in this encounter.  Lab Orders  No laboratory test(s) ordered today    Diagnostics: Spirometry:  Tracings reviewed. Her effort: {Blank single:19197::"Good reproducible efforts.","It was hard to get consistent efforts and there is a question as to whether this reflects a maximal maneuver.","Poor effort, data can not be interpreted."} FVC: ***L FEV1: ***L, ***% predicted FEV1/FVC ratio: ***% Interpretation: {Blank single:19197::"Spirometry consistent with mild obstructive disease","Spirometry consistent with moderate obstructive disease","Spirometry consistent with severe obstructive disease","Spirometry consistent with possible restrictive disease","Spirometry consistent with mixed obstructive and restrictive disease","Spirometry uninterpretable due to technique","Spirometry consistent with normal pattern","No overt abnormalities noted given today's efforts"}.  Please see scanned spirometry results for details.  Skin Testing: {Blank single:19197::"Select foods","Environmental allergy panel","Environmental allergy panel and select foods","Food allergy panel","None","Deferred due to recent antihistamines use"}. *** Results discussed with patient/family.   Medication List:  Current Outpatient Medications  Medication Sig Dispense Refill  . albuterol (PROVENTIL) (2.5 MG/3ML) 0.083% nebulizer solution Take 3 mLs (2.5 mg total) by nebulization every 6 (six) hours as  needed for wheezing or shortness of breath. 150 mL 1  . albuterol (VENTOLIN HFA) 108 (90  Base) MCG/ACT inhaler Inhale 1-2 puffs into the lungs every 6 (six) hours as needed for wheezing or shortness of breath. 1 each 6  . betamethasone dipropionate 0.05 % cream Apply topically 2 (two) times daily. 30 g 0  . cetirizine (ZYRTEC) 10 MG tablet Take 10 mg by mouth daily.    Lennox Solders (EUCRISA) 2 % OINT Apply 1 Application topically 2 (two) times daily as needed (mild rash). 60 g 5  . metFORMIN (GLUCOPHAGE) 500 MG tablet 1/2 po with lunch daily and 1/2 tab with dinner 30 tablet 0  . sertraline (ZOLOFT) 100 MG tablet Take 1 tablet (100 mg total) by mouth daily. 30 tablet 3  . Spacer/Aero-Holding Chambers (AEROCHAMBER PLUS) inhaler Use as instructed 1 each 2  . topiramate (TOPAMAX) 100 MG tablet Take 2 tablets (200 mg total) by mouth at bedtime. 180 tablet 4  . triamcinolone ointment (KENALOG) 0.1 % Apply 1 Application topically 2 (two) times daily as needed (rash flare). Do not use on the face, neck, armpits or groin area. Do not use more than 3 weeks in a row. 30 g 0   No current facility-administered medications for this visit.   Allergies: No Known Allergies I reviewed her past medical history, social history, family history, and environmental history and no significant changes have been reported from her previous visit.  Review of Systems  Constitutional:  Negative for appetite change, chills, fever and unexpected weight change.  HENT:  Negative for congestion and rhinorrhea.   Eyes:  Negative for itching.  Respiratory:  Negative for cough, chest tightness, shortness of breath and wheezing.   Cardiovascular:  Negative for chest pain.  Gastrointestinal:  Negative for abdominal pain.  Genitourinary:  Negative for difficulty urinating.  Skin:  Positive for rash.  Allergic/Immunologic: Positive for environmental allergies. Negative for food allergies.  Neurological:  Negative for headaches.    Objective: There were no vitals taken for this visit. There is no height or weight on file to calculate BMI. Physical Exam Vitals and nursing note reviewed.  Constitutional:      Appearance: Normal appearance. She is well-developed. She is obese.  HENT:     Head: Normocephalic and atraumatic.     Right Ear: Tympanic membrane and external ear normal.     Left Ear: Tympanic membrane and external ear normal.     Nose: Nose normal.     Mouth/Throat:     Mouth: Mucous membranes are moist.     Pharynx: Oropharynx is clear.  Eyes:     Conjunctiva/sclera: Conjunctivae normal.  Cardiovascular:     Rate and Rhythm: Normal rate and regular rhythm.     Heart sounds: Normal heart sounds. No murmur heard.    No friction rub. No gallop.  Pulmonary:     Effort: Pulmonary effort is normal.     Breath sounds: Normal breath sounds. No wheezing, rhonchi or rales.  Musculoskeletal:     Cervical back: Neck supple.  Skin:    General: Skin is warm.     Findings: Rash present.     Comments: Dry, fissured skin on the hands b/l.  Neurological:     Mental Status: She is alert and oriented to person, place, and time.  Psychiatric:        Behavior: Behavior normal.  Previous notes and tests were reviewed. The plan was reviewed with the patient/family, and all questions/concerned were addressed.  It was my pleasure to see Naryiah today and participate in her care. Please feel  free to contact me with any questions or concerns.  Sincerely,  Rexene Alberts, DO Allergy & Immunology  [facility] office: [place] office: [phone]

## 2022-05-10 ENCOUNTER — Ambulatory Visit: Payer: Medicaid Other | Admitting: Allergy

## 2022-05-10 DIAGNOSIS — L301 Dyshidrosis [pompholyx]: Secondary | ICD-10-CM

## 2022-05-10 DIAGNOSIS — J301 Allergic rhinitis due to pollen: Secondary | ICD-10-CM

## 2022-05-10 DIAGNOSIS — T781XXD Other adverse food reactions, not elsewhere classified, subsequent encounter: Secondary | ICD-10-CM

## 2022-05-10 DIAGNOSIS — J4520 Mild intermittent asthma, uncomplicated: Secondary | ICD-10-CM

## 2022-05-12 ENCOUNTER — Other Ambulatory Visit: Payer: Self-pay

## 2022-05-12 DIAGNOSIS — F32A Depression, unspecified: Secondary | ICD-10-CM

## 2022-05-12 MED ORDER — SERTRALINE HCL 100 MG PO TABS: 100.0000 mg | ORAL_TABLET | Freq: Every day | ORAL | 1 refills | Status: DC

## 2022-05-20 NOTE — Progress Notes (Deleted)
Follow Up Note  RE: Pamela Bowman MRN: [id number] DOB: 13-Sep-2000 Date of Office Visit: 05/21/2022  Referring provider: Elenore Paddy, NP Primary care provider: Elenore Paddy, NP  Chief Complaint: No chief complaint on file.  History of Present Illness: I had the pleasure of seeing Pamela Bowman for a follow up visit at [facility] on 05/20/2022. She is a 21 y.o. female, who is being followed for dyshidrotic eczema, allergic rhinitis, adverse food reaction and asthma. Her previous allergy office visit was on 04/09/2022 with Dr. Selena Batten. Today is a regular follow up visit.  Dyshidrotic eczema Started 10 months ago mainly on her hands. History of eczema as a child. Using OTC hydrocortisone with some benefit. See below for proper skin care. Use fragrance free and dye free products. Wear gloves when cleaning/cooking/doing dishes. Use Eucrisa (crisaborole) 2% ointment twice a day on mild rash flares on the face and body. This is a non-steroid ointment. Samples given. Use triamcinolone 0.1% ointment twice a day as needed for rash flares. Do not use on the face, neck, armpits or groin area. Do not use more than 3 weeks in a row.  Consider starting Dupixent - handout given.    Other allergic rhinitis Some symptoms mainly around cats. Today's skin testing showed: Positive to trees and grass. Start environmental control measures as below. Use over the counter antihistamines such as Zyrtec (cetirizine), Claritin (loratadine), Allegra (fexofenadine), or Xyzal (levocetirizine) daily as needed. May take twice a day during allergy flares. May switch antihistamines every few months.   Other adverse food reactions, not elsewhere classified, subsequent encounter One episode of throat tightness after drinking sunny-D. Tolerates other juices and oranges with no issues. Continue to avoid Sunny-D for now.   Mild intermittent asthma without complication Has asthma flare a few times per year  and uses albuterol prn with good benefit. No prednisone use. Today's spirometry showed restriction - most likely due to body habitus. May use albuterol rescue inhaler 2 puffs every 4 to 6 hours as needed for shortness of breath, chest tightness, coughing, and wheezing. Monitor frequency of use.    Return in about 4 weeks (around 05/07/2022).  Assessment and Plan: Pamela Bowman is a 21 y.o. female with: No problem-specific Assessment & Plan notes found for this encounter.  No follow-ups on file.  No orders of the defined types were placed in this encounter.  Lab Orders  No laboratory test(s) ordered today    Diagnostics: Spirometry:  Tracings reviewed. Her effort: {Blank single:19197::"Good reproducible efforts.","It was hard to get consistent efforts and there is a question as to whether this reflects a maximal maneuver.","Poor effort, data can not be interpreted."} FVC: ***L FEV1: ***L, ***% predicted FEV1/FVC ratio: ***% Interpretation: {Blank single:19197::"Spirometry consistent with mild obstructive disease","Spirometry consistent with moderate obstructive disease","Spirometry consistent with severe obstructive disease","Spirometry consistent with possible restrictive disease","Spirometry consistent with mixed obstructive and restrictive disease","Spirometry uninterpretable due to technique","Spirometry consistent with normal pattern","No overt abnormalities noted given today's efforts"}.  Please see scanned spirometry results for details.  Skin Testing: {Blank single:19197::"Select foods","Environmental allergy panel","Environmental allergy panel and select foods","Food allergy panel","None","Deferred due to recent antihistamines use"}. *** Results discussed with patient/family.   Medication List:  Current Outpatient Medications  Medication Sig Dispense Refill  . albuterol (PROVENTIL) (2.5 MG/3ML) 0.083% nebulizer solution Take 3 mLs (2.5 mg total) by nebulization every 6 (six) hours  as needed for wheezing or shortness of breath. 150 mL 1  . albuterol (VENTOLIN HFA) 108 (  90 Base) MCG/ACT inhaler Inhale 1-2 puffs into the lungs every 6 (six) hours as needed for wheezing or shortness of breath. 1 each 6  . betamethasone dipropionate 0.05 % cream Apply topically 2 (two) times daily. 30 g 0  . cetirizine (ZYRTEC) 10 MG tablet Take 10 mg by mouth daily.    Lennox Solders (EUCRISA) 2 % OINT Apply 1 Application topically 2 (two) times daily as needed (mild rash). 60 g 5  . metFORMIN (GLUCOPHAGE) 500 MG tablet 1/2 po with lunch daily and 1/2 tab with dinner 30 tablet 0  . sertraline (ZOLOFT) 100 MG tablet Take 1 tablet (100 mg total) by mouth daily. 90 tablet 1  . Spacer/Aero-Holding Chambers (AEROCHAMBER PLUS) inhaler Use as instructed 1 each 2  . topiramate (TOPAMAX) 100 MG tablet Take 2 tablets (200 mg total) by mouth at bedtime. 180 tablet 4  . triamcinolone ointment (KENALOG) 0.1 % Apply 1 Application topically 2 (two) times daily as needed (rash flare). Do not use on the face, neck, armpits or groin area. Do not use more than 3 weeks in a row. 30 g 0   No current facility-administered medications for this visit.   Allergies: No Known Allergies I reviewed her past medical history, social history, family history, and environmental history and no significant changes have been reported from her previous visit.  Review of Systems  Constitutional:  Negative for appetite change, chills, fever and unexpected weight change.  HENT:  Negative for congestion and rhinorrhea.   Eyes:  Negative for itching.  Respiratory:  Negative for cough, chest tightness, shortness of breath and wheezing.   Cardiovascular:  Negative for chest pain.  Gastrointestinal:  Negative for abdominal pain.  Genitourinary:  Negative for difficulty urinating.  Skin:  Positive for rash.  Allergic/Immunologic: Positive for environmental allergies. Negative for food allergies.  Neurological:  Negative for  headaches.   Objective: There were no vitals taken for this visit. There is no height or weight on file to calculate BMI. Physical Exam Vitals and nursing note reviewed.  Constitutional:      Appearance: Normal appearance. She is well-developed. She is obese.  HENT:     Head: Normocephalic and atraumatic.     Right Ear: Tympanic membrane and external ear normal.     Left Ear: Tympanic membrane and external ear normal.     Nose: Nose normal.     Mouth/Throat:     Mouth: Mucous membranes are moist.     Pharynx: Oropharynx is clear.  Eyes:     Conjunctiva/sclera: Conjunctivae normal.  Cardiovascular:     Rate and Rhythm: Normal rate and regular rhythm.     Heart sounds: Normal heart sounds. No murmur heard.    No friction rub. No gallop.  Pulmonary:     Effort: Pulmonary effort is normal.     Breath sounds: Normal breath sounds. No wheezing, rhonchi or rales.  Musculoskeletal:     Cervical back: Neck supple.  Skin:    General: Skin is warm.     Findings: Rash present.     Comments: Dry, fissured skin on the hands b/l.  Neurological:     Mental Status: She is alert and oriented to person, place, and time.  Psychiatric:        Behavior: Behavior normal.  Previous notes and tests were reviewed. The plan was reviewed with the patient/family, and all questions/concerned were addressed.  It was my pleasure to see Audreana today and participate in her care. Please  feel free to contact me with any questions or concerns.  Sincerely,  Rexene Alberts, DO Allergy & Immunology  [facility] office: [place] office: [phone]

## 2022-05-21 ENCOUNTER — Ambulatory Visit: Payer: Medicaid Other | Admitting: Allergy

## 2022-05-21 DIAGNOSIS — J4520 Mild intermittent asthma, uncomplicated: Secondary | ICD-10-CM

## 2022-05-21 DIAGNOSIS — J301 Allergic rhinitis due to pollen: Secondary | ICD-10-CM

## 2022-05-21 DIAGNOSIS — L301 Dyshidrosis [pompholyx]: Secondary | ICD-10-CM

## 2022-05-21 DIAGNOSIS — T781XXD Other adverse food reactions, not elsewhere classified, subsequent encounter: Secondary | ICD-10-CM

## 2022-06-21 NOTE — Progress Notes (Unsigned)
PATIENT: Pamela Bowman DOB: 26-Aug-2000  REASON FOR VISIT: Follow up HISTORY FROM: Patient PRIMARY NEUROLOGIST: Dr. Terrace Arabia   HISTORY  Pamela Bowman 21 years old female, seen in refer by her primary care physician Dr. 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 Initial evaluation was on Aug 29 2017.  She is accompanied by her grandmother   During her most recent yearly checkup by Dr. Donald Prose on August 20, 2017, there was noted bilateral disc margin is indistinct, this is crowded, she denies significant headaches, no loss of vision, but had transient blurry vision occasionally.  She is referred here for possible pseudotumor cerebri, she does have a history of obesity, but denies significant weight change over the past few weeks.   She noted left upper and lower eyelid swelling since August 26, 2017 gradually getting worse, to the point of walking left vision sometimes,   She was given 2 rounds of double strength Bactrim since August 04, 2017 for cellulitis    Lab: A1C 5.5, BMP, CBC,   MRI of the orbit without contrast showed no significant abnormality in March 2019 Left eyelid cellulitis is much improved with antibiotic treatment, She has 2-3 headaches each week, lateralized, pounding headache with associated light noise sensitivity, she has been taking frequent BC powders, she has been absent from school for many days, complains of blurred vision, especially with sudden positional change.   Lumbar puncture on September 29, 2017 showed opening pressure of 30 cm water,   Normal CSF, WBC of 4, RBC of 0, protein of 32,   Her headache overall has  much improved, She is tolerating Topamax 100 mg twice a day, complains of numbness, tingly,  loss of appetite, now she has headache 2-3 times each week, Maxalt 5 mg as needed initially works well, now is not as effective   Reviewed feedback from ophthalmologist Dr. 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 Dione Booze recently, most recent visit in July 2021, there was no significant abnormality noted   She denies visual change, migraine has improved, about twice a week, responding well to Maxalt  Update June 24, 2021 SS: Here today alone, has gained 5 lbs, now weighs 187 lbs. She thought she lost weight, going up steps, cooking at home, stopping drink soda. Still on topamax 200 mg at bedtime. Seeing Dr. Dione Booze in March. Saw him twice last year, end of year in 2021 December. Has drivers license now. Migraines are essentially resolved, maybe 1/month, will take extra strength Tylenol, it helps. Maxalt makes her feel tingly, anxious. No vision disturbances, needs to get new frames. Needs new PCP.   Update June 24, 2022 SS: had started weight loss program for a few weeks, lost follow-up, had lost 20 lbs. Went to see Dr. Dione Booze this year, told everything stable, notes not available to me. Weight is 391 today, BMI 72. Still on topamax 200 mg daily, but rarely takes, denies any headaches.  Vision is fine.   REVIEW OF SYSTEMS: Out of a complete 14 system review of symptoms, the patient complains only of the following symptoms, and all other reviewed systems are negative.   See HPI  ALLERGIES:  Allergies  Allergen Reactions   Grass Pollen(K-O-R-T-Swt Vern)    Pollen Extract     HOME MEDICATIONS: Outpatient Medications Prior to Visit  Medication Sig Dispense Refill   Acetaminophen (TYLENOL PO) Take by mouth as needed.     albuterol (PROVENTIL) (2.5 MG/3ML) 0.083% nebulizer solution Take 3 mLs (2.5 mg total) by nebulization every 6 (six) hours as needed for wheezing or shortness  of breath. 150 mL 1   albuterol (VENTOLIN HFA) 108 (90 Base) MCG/ACT inhaler Inhale 1-2 puffs into the lungs every 6 (six) hours as needed for wheezing or shortness of breath. 1 each 6   betamethasone dipropionate 0.05 % cream Apply topically 2 (two) times daily. 30 g 0   cetirizine (ZYRTEC) 10 MG tablet Take 10 mg by mouth daily.     Crisaborole (EUCRISA) 2 % OINT Apply 1 Application topically 2 (two) times daily as needed (mild rash). 60 g 5   metFORMIN (GLUCOPHAGE) 500 MG tablet 1/2 po with lunch daily and 1/2 tab with dinner 30 tablet 0   sertraline (ZOLOFT) 100 MG tablet Take 1 tablet (100 mg total) by mouth daily. 90 tablet 1   topiramate (TOPAMAX) 100 MG tablet Take 2 tablets (200 mg total) by mouth at bedtime. 180 tablet 4   triamcinolone ointment (KENALOG) 0.1 % Apply 1 Application topically 2 (two) times daily as needed (rash flare). Do not use on the face, neck, armpits or groin area. Do not use more than 3 weeks in a row. 30 g 0   Spacer/Aero-Holding Chambers (AEROCHAMBER PLUS) inhaler Use as instructed 1 each 2   No facility-administered medications prior to visit.    PAST MEDICAL HISTORY: Past Medical History:  Diagnosis Date   Anxiety    Asthma    Depression    Diabetes mellitus without complication (HCC)    says it is pre-diabetes   Eczema    Ingrown toenail    Optic nerve edema    Prediabetes    Seasonal allergies     PAST SURGICAL HISTORY: Past Surgical History:  Procedure Laterality Date   TONSILLECTOMY AND ADENOIDECTOMY Bilateral 10/15/2015   Procedure: TONSILLECTOMY AND ADENOIDECTOMY;  Surgeon: Newman Pies, MD;  Location: [facility] OR;  Service: ENT;  Laterality: Bilateral;   WISDOM TOOTH EXTRACTION      FAMILY HISTORY: Family History  Problem Relation Age of Onset   Depression Mother    Anxiety disorder Mother    Obesity Mother    Breast cancer Maternal Grandmother    Obesity Maternal Grandmother    Obesity Paternal Grandmother    Cancer Paternal Grandfather         unsure of type   Asthma Paternal Aunt    Diabetes Other    Hypertension Other     SOCIAL HISTORY: Social History   Socioeconomic History   Marital status: Single    Spouse name: Not on file   Number of children: 0   Years of education: high school   Highest education level: Not on file  Occupational History   Occupation: Consulting civil engineer  Tobacco Use   Smoking status: Never    Passive exposure: Current   Smokeless tobacco: Never  Vaping Use   Vaping Use: Never used  Substance and Sexual Activity   Alcohol use: No   Drug use: No   Sexual activity: Not on file  Other Topics Concern   Not on file  Social History Narrative   12th grade at [employer].   Right-handed.  Lives at home with father and grandmother.   Some day use of caffeine.   Social Determinants of Health   Financial Resource Strain: Not on file  Food Insecurity: Not on file  Transportation Needs: Not on file  Physical Activity: Not on file  Stress: Not on file  Social Connections: Not on file  Intimate Partner Violence: Not on file   PHYSICAL EXAM  Vitals:   06/24/22 0817  BP: (!) 148/84  Pulse: 81  Weight: (!) 391 lb (177.4 kg)  Height: 5' 1.6" (1.565 m)    Body mass index is 72.45 kg/m.  Generalized: Well developed, in no acute distress  Neurological examination  Mentation: Alert oriented to time, place, history taking. Follows all commands speech and language fluent Cranial nerve II-XII: Pupils were equal round reactive to light. Extraocular movements were full, visual field were full on confrontational test. Facial sensation and strength were normal.  Head turning and shoulder shrug  were normal and symmetric. Motor: The motor testing reveals 5 over 5 strength of all 4 extremities. Good symmetric motor tone is noted throughout.  Sensory: Sensory testing is intact to soft touch on all 4 extremities. No evidence of extinction is noted.  Coordination: Cerebellar testing reveals good  finger-nose-finger and heel-to-shin bilaterally.  Gait and station: Gait is normal, but limited by large body habitus   DIAGNOSTIC DATA (LABS, IMAGING, TESTING) - I reviewed patient records, labs, notes, testing and imaging myself where available.  Lab Results  Component Value Date   WBC 6.1 12/04/2021   HGB 13.5 12/04/2021   HCT 42.0 12/04/2021   MCV 89.7 12/04/2021   PLT 330.0 12/04/2021      Component Value Date/Time   NA 139 03/04/2022 0848   K 3.9 03/04/2022 0848   CL 106 03/04/2022 0848   CO2 23 03/04/2022 0848   GLUCOSE 94 03/04/2022 0848   BUN 15 03/04/2022 0848   CREATININE 0.87 03/04/2022 0848   CREATININE 0.95 08/09/2017 0000   CALCIUM 9.2 03/04/2022 0848   PROT 7.8 12/04/2021 1410   ALBUMIN 4.4 12/04/2021 1410   AST 32 12/04/2021 1410   ALT 27 12/04/2021 1410   ALKPHOS 46 12/04/2021 1410   BILITOT 0.7 12/04/2021 1410   GFRNONAA NOT CALCULATED 10/15/2015 0843   GFRAA NOT CALCULATED 10/15/2015 0843   Lab Results  Component Value Date   CHOL 200 12/04/2021   HDL 36.20 (L) 12/04/2021   LDLCALC 149 (H) 12/04/2021   TRIG 74.0 12/04/2021   CHOLHDL 6 12/04/2021   Lab Results  Component Value Date   HGBA1C 5.8 12/04/2021   No results found for: "VITAMINB12" Lab Results  Component Value Date   TSH 3.41 12/04/2021    ASSESSMENT AND PLAN 21 y.o. year old female  has a past medical history of Anxiety, Asthma, Depression, Diabetes mellitus without complication (HCC), Eczema, Ingrown toenail, Optic nerve edema, Prediabetes, and Seasonal allergies. here with:  1.  Morbid obesity, BMI 72 2.  Pseudotumor cerebri 3.  Chronic migraine headache  -Doing well, symptoms under excellent control -I will get the recent office visit from Dr. Dione Booze to review -Would recommend taking Topamax 200 mg at bedtime, could also benefit weight loss -Discussed the importance of weight loss for long-term management of this condition -Continue close follow-up with PCP, I will see her  back in 6 months or sooner if needed  Otila Kluver, DNP 06/24/2022, 8:33 AM [facility] [address], [address] [place], [place] [address] [phone]

## 2022-06-24 ENCOUNTER — Ambulatory Visit: Payer: Medicaid Other | Admitting: Neurology

## 2022-06-24 ENCOUNTER — Encounter: Payer: Self-pay | Admitting: Neurology

## 2022-06-24 ENCOUNTER — Telehealth: Payer: Self-pay | Admitting: Neurology

## 2022-06-24 VITALS — BP 148/84 | HR 81 | Ht 61.6 in | Wt 391.0 lb

## 2022-06-24 DIAGNOSIS — Z6841 Body Mass Index (BMI) 40.0 and over, adult: Secondary | ICD-10-CM

## 2022-06-24 DIAGNOSIS — G932 Benign intracranial hypertension: Secondary | ICD-10-CM | POA: Diagnosis not present

## 2022-06-24 DIAGNOSIS — G43709 Chronic migraine without aura, not intractable, without status migrainosus: Secondary | ICD-10-CM | POA: Diagnosis not present

## 2022-06-24 MED ORDER — TOPIRAMATE 100 MG PO TABS: 200.0000 mg | ORAL_TABLET | Freq: Every day | ORAL | 4 refills | Status: DC

## 2022-06-24 NOTE — Telephone Encounter (Signed)
Please get records from Dr. Dione Booze for office visit and exam in 2023. Thanks

## 2022-06-24 NOTE — Patient Instructions (Signed)
Please take Topamax, get back in with weight loss program, see you back in 6 months, I will get records from Dr. Dione Booze

## 2022-06-30 ENCOUNTER — Ambulatory Visit: Payer: Medicaid Other | Admitting: Student

## 2022-06-30 NOTE — Progress Notes (Deleted)
/  Lucina Mellow Patient name: Pamela Bowman MRN [id number]  Date of birth: 29-Sep-2000 Chief Complaint:   No chief complaint on file.  History of Present Illness:   Pamela Bowman is a 21 y.o. G0P0000 {race:25618} female being seen today for a routine annual exam.  Current complaints: ***  No LMP recorded.   The pregnancy intention screening data noted above was reviewed. Potential methods of contraception were discussed. The patient elected to proceed with No data recorded.   Last pap ***. Results were: {Pap findings:25134}. H/O abnormal pap: {yes/yes***/no:23866} Last mammogram: ***. Results were: {normal, abnormal, n/a:23837}. Family h/o breast cancer: {yes***/no:23838} Last colonoscopy: ***. Results were: {normal, abnormal, n/a:23837}. Family h/o colorectal cancer: {yes***/no:23838}     04/01/2022    4:10 PM 02/11/2022   10:18 AM 01/12/2022    7:46 AM 01/08/2022   10:22 AM 12/04/2021    1:26 PM  Depression screen PHQ 2/9  Decreased Interest 1 2 1 2 2   Down, Depressed, Hopeless 0 1 1 2 1   PHQ - 2 Score 1 3 2 4 3   Altered sleeping 1 0 0 3 3  Tired, decreased energy 0 1 1 2 2   Change in appetite 0 2 0 2 3  Feeling bad or failure about yourself  0 0 0 2 1  Trouble concentrating 0 1 0 3 2  Moving slowly or fidgety/restless 0 0 0 1 0  Suicidal thoughts 0 0 0 0 0  PHQ-9 Score 2 7 3 17 14   Difficult doing work/chores Somewhat difficult Not difficult at all Somewhat difficult          01/08/2022   10:24 AM 12/04/2021    1:25 PM  GAD 7 : Generalized Anxiety Score  Nervous, Anxious, on Edge 3 3  Control/stop worrying 2 2  Worry too much - different things 2 3  Trouble relaxing 1 2  Restless 0 1  Easily annoyed or irritable 2 3  Afraid - awful might happen 1 2  Total GAD 7 Score 11 16     Review of Systems:   Pertinent items are noted in HPI Denies any headaches, blurred vision, fatigue, shortness of breath, chest pain, abdominal pain, abnormal vaginal discharge/itching/odor/irritation,  problems with periods, bowel movements, urination, or intercourse unless otherwise stated above. Pertinent History Reviewed:  Reviewed past medical,surgical, social and family history.  Reviewed problem list, medications and allergies. Physical Assessment:  There were no vitals filed for this visit.There is no height or weight on file to calculate BMI.        Physical Examination:   General appearance - well appearing, and in no distress  Mental status - alert, oriented to person, place, and time  Psych:  She has a normal mood and affect  Skin - warm and dry, normal color, no suspicious lesions noted  Chest - effort normal, all lung fields clear to auscultation bilaterally  Heart - normal rate and regular rhythm  Neck:  midline trachea, no thyromegaly or nodules  Breasts - breasts appear normal, no suspicious masses, no skin or nipple changes or  axillary nodes  Abdomen - soft, nontender, nondistended, no masses or organomegaly  Pelvic - VULVA: normal appearing vulva with no masses, tenderness or lesions  VAGINA: normal appearing vagina with normal color and discharge, no lesions  CERVIX: normal appearing cervix without discharge or lesions, no CMT  Thin prep pap is {Desc; done/not:10129} *** HR HPV cotesting  UTERUS: uterus is felt to be normal size, shape, consistency  and nontender   ADNEXA: No adnexal masses or tenderness noted.  Rectal - normal rectal, good sphincter tone, no masses felt. Hemoccult: ***  Extremities:  No swelling or varicosities noted  Chaperone present for exam  No results found for this or any previous visit (from the past 24 hour(s)).  Assessment & Plan:  1) Well-Woman Exam  2) *** Six-month f/u assessment. suggest a trial of at least six months before making any changes in dose, adding a medication, or switching to a new medication. Labs/procedures today: ***  Mammogram: {Mammo f/u:25212::"@ 21yo"}, or sooner if problems Colonoscopy: {TCS f/u:25213::"@  21yo"}, or sooner if problems  No orders of the defined types were placed in this encounter.   Meds: No orders of the defined types were placed in this encounter.   Follow-up: No follow-ups on file.  Corlis Hove, NP 06/30/2022 7:46 AM

## 2022-07-01 ENCOUNTER — Ambulatory Visit: Payer: Medicaid Other | Admitting: Nurse Practitioner

## 2022-07-29 ENCOUNTER — Ambulatory Visit: Payer: Medicaid Other | Admitting: Nurse Practitioner

## 2022-07-29 VITALS — BP 122/68 | HR 95 | Temp 98.0°F | Ht 61.6 in | Wt 394.5 lb

## 2022-07-29 DIAGNOSIS — F419 Anxiety disorder, unspecified: Secondary | ICD-10-CM | POA: Diagnosis not present

## 2022-07-29 DIAGNOSIS — Z6841 Body Mass Index (BMI) 40.0 and over, adult: Secondary | ICD-10-CM

## 2022-07-29 DIAGNOSIS — F32A Depression, unspecified: Secondary | ICD-10-CM

## 2022-07-29 DIAGNOSIS — E66813 Obesity, class 3: Secondary | ICD-10-CM

## 2022-07-29 DIAGNOSIS — R7303 Prediabetes: Secondary | ICD-10-CM

## 2022-07-29 MED ORDER — METFORMIN HCL 500 MG PO TABS: ORAL_TABLET | ORAL | 1 refills | Status: DC

## 2022-07-29 MED ORDER — SERTRALINE HCL 50 MG PO TABS: ORAL_TABLET | ORAL | 1 refills | Status: DC

## 2022-07-29 NOTE — Assessment & Plan Note (Signed)
Chronic, consider rechecking A1c at next office visit.

## 2022-07-29 NOTE — Assessment & Plan Note (Signed)
Chronic, patient's weight is trending upward.  She has financial barriers related to her ability to afford pharmacological treatment for weight loss medications.  Offered [facility]erring [facility] but patient declined today.  Also discussed bariatric surgery referral, but patient has declined today.  Will refill metformin.  I have educated her regarding lifestyle modification aimed at weight loss.  Patient will follow-up in about 6 weeks to see if she is lost any weight.

## 2022-07-29 NOTE — Assessment & Plan Note (Signed)
Chronic, per shared decision making will increase Zoloft from 100 to 150 mg by mouth daily.  May need to also consider addition of BuSpar or hydroxyzine depending on how she tolerates increasing Zoloft.  Patient warned of risk of suicidal ideation and if this were to occur to notify our office as soon as possible.  Patient reports understanding.  Patient to follow-up in 6 weeks, or sooner as needed.

## 2022-07-29 NOTE — Progress Notes (Signed)
Established Patient Office Visit  Subjective   Patient ID: Pamela Bowman, female    DOB: 03/13/2001  Age: 22 y.o. MRN: [id number]  Chief Complaint  Patient presents with   Obesity    Obesity: Was being [facility] at [facility] and was losing weight regularly.  Appears to have been lost to follow-up, reports having called multiple times and has been unable to get an appointment scheduled.  Needs refill on metformin.  Would like to discuss assistance with weight loss.  Prediabetes: Last A1c 5.8.  Anxiety/depression: On Zoloft, reports increased emotional stressors at home.  Would like to discuss additional treatment options as she is having a hard time with sleeping.  Denies suicidal ideation.  DMV paperwork: Has paperwork to be filled out related to her chronic conditions.    Review of Systems  Gastrointestinal:  Negative for abdominal pain and constipation.  Psychiatric/Behavioral:  Negative for suicidal ideas. The patient is nervous/anxious and has insomnia.       Objective:     BP 122/68   Pulse 95   Temp 98 F (36.7 C) (Temporal)   Ht 5' 1.6" (1.565 m)   Wt (!) 394 lb 8 oz (178.9 kg)   SpO2 94%   BMI 73.10 kg/m  BP Readings from Last 3 Encounters:  07/29/22 122/68  06/24/22 (!) 148/84  04/09/22 122/74   Wt Readings from Last 3 Encounters:  07/29/22 (!) 394 lb 8 oz (178.9 kg)  06/24/22 (!) 391 lb (177.4 kg)  04/09/22 (!) 374 lb 9.6 oz (169.9 kg)      Physical Exam Vitals reviewed.  Constitutional:      General: She is not in acute distress.    Appearance: Normal appearance.  HENT:     Head: Normocephalic and atraumatic.  Neck:     Vascular: No carotid bruit.  Cardiovascular:     Rate and Rhythm: Normal rate and regular rhythm.     Pulses: Normal pulses.     Heart sounds: Normal heart sounds.  Pulmonary:     Effort: Pulmonary effort is normal.     Breath sounds: Normal breath sounds.  Skin:    General: Skin is warm and dry.   Neurological:     General: No focal deficit present.     Mental Status: She is alert and oriented to person, place, and time.  Psychiatric:        Mood and Affect: Mood normal.        Behavior: Behavior normal.        Judgment: Judgment normal.      No results found for any visits on 07/29/22.    The ASCVD Risk score (Arnett DK, et al., 2019) failed to calculate for the following reasons:   The 2019 ASCVD risk score is only valid for ages 50 to 68    Assessment & Plan:   Problem List Items Addressed This Visit       Other   Obesity - Primary    Chronic, patient's weight is trending upward.  She has financial barriers related to her ability to afford pharmacological treatment for weight loss medications.  Offered rereferring her to [facility] but patient declined today.  Also discussed bariatric surgery referral, but patient has declined today.  Will refill metformin.  I have educated her regarding lifestyle modification aimed at weight loss.  Patient will follow-up in about 6 weeks to see if she is lost any weight.  Relevant Medications   metFORMIN (GLUCOPHAGE) 500 MG tablet   Anxiety and depression    Chronic, per shared decision making will increase Zoloft from 100 to 150 mg by mouth daily.  May need to also consider addition of BuSpar or hydroxyzine depending on how she tolerates increasing Zoloft.  Patient warned of risk of suicidal ideation and if this were to occur to notify our office as soon as possible.  Patient reports understanding.  Patient to follow-up in 6 weeks, or sooner as needed.      Relevant Medications   sertraline (ZOLOFT) 50 MG tablet   Prediabetes    Chronic, consider rechecking A1c at next office visit.      Relevant Medications   metFORMIN (GLUCOPHAGE) 500 MG tablet   DMV paperwork also filled out for patient today. Return in about 6 weeks (around 09/09/2022) for f/u with Pamela Bowman.  Total time spent on encounter today  included face-to-face interaction, chart review, filling out DMV paperwork, and development/discussion of treatment plan.   Ailene Ards, NP

## 2022-07-29 NOTE — Assessment & Plan Note (Signed)
>>  ASSESSMENT AND PLAN FOR PREDIABETES WRITTEN ON 07/29/2022  9:50 AM BY Adella Agee E, NP  Chronic, consider rechecking A1c at next office visit.

## 2022-07-29 NOTE — Patient Instructions (Signed)
1100-1200 cals/day My Fitness Pal Low glycemic index foods - choose 4-5 options from proteins/veggies/fruits focus on this as your main calorie source 150 minutes/week of exercise 60 ounces of water a day, if exercising regularly increase to 80 ounces a day Food scale - weigh your food so you know exactly how much you are eating in order to track accurately

## 2022-08-13 ENCOUNTER — Other Ambulatory Visit: Payer: Self-pay | Admitting: Nurse Practitioner

## 2022-08-13 DIAGNOSIS — F419 Anxiety disorder, unspecified: Secondary | ICD-10-CM

## 2022-08-26 ENCOUNTER — Ambulatory Visit: Payer: Medicaid Other | Admitting: Student

## 2022-09-09 ENCOUNTER — Ambulatory Visit: Payer: Medicaid Other | Admitting: Nurse Practitioner

## 2022-09-09 ENCOUNTER — Encounter: Payer: Self-pay | Admitting: Nurse Practitioner

## 2022-09-09 VITALS — BP 106/66 | HR 100 | Temp 98.8°F | Ht 61.6 in | Wt 383.2 lb

## 2022-09-09 DIAGNOSIS — Z6841 Body Mass Index (BMI) 40.0 and over, adult: Secondary | ICD-10-CM

## 2022-09-09 DIAGNOSIS — J4520 Mild intermittent asthma, uncomplicated: Secondary | ICD-10-CM | POA: Diagnosis not present

## 2022-09-09 DIAGNOSIS — R7303 Prediabetes: Secondary | ICD-10-CM | POA: Diagnosis not present

## 2022-09-09 DIAGNOSIS — L309 Dermatitis, unspecified: Secondary | ICD-10-CM

## 2022-09-09 DIAGNOSIS — F419 Anxiety disorder, unspecified: Secondary | ICD-10-CM | POA: Diagnosis not present

## 2022-09-09 DIAGNOSIS — F32A Depression, unspecified: Secondary | ICD-10-CM

## 2022-09-09 MED ORDER — METFORMIN HCL 500 MG PO TABS: ORAL_TABLET | ORAL | 3 refills | Status: AC

## 2022-09-09 MED ORDER — ALBUTEROL SULFATE (2.5 MG/3ML) 0.083% IN NEBU: 2.5000 mg | INHALATION_SOLUTION | Freq: Four times a day (QID) | RESPIRATORY_TRACT | 1 refills | Status: DC | PRN

## 2022-09-09 MED ORDER — BETAMETHASONE DIPROPIONATE 0.05 % EX CREA: TOPICAL_CREAM | Freq: Two times a day (BID) | CUTANEOUS | 0 refills | Status: DC

## 2022-09-09 MED ORDER — ALBUTEROL SULFATE HFA 108 (90 BASE) MCG/ACT IN AERS: 1.0000 | INHALATION_SPRAY | Freq: Four times a day (QID) | RESPIRATORY_TRACT | 6 refills | Status: DC | PRN

## 2022-09-09 MED ORDER — SERTRALINE HCL 50 MG PO TABS: ORAL_TABLET | ORAL | 1 refills | Status: DC

## 2022-09-09 NOTE — Assessment & Plan Note (Signed)
Chronic, patient to continue on metformin.  Refill sent to patient's pharmacy.

## 2022-09-09 NOTE — Assessment & Plan Note (Signed)
Chronic, patient has lost approximately 11 pounds since last office visit which is great.  Continue lifestyle modification.

## 2022-09-09 NOTE — Progress Notes (Signed)
Established Patient Office Visit  Subjective   Patient ID: Pamela Bowman, female    DOB: Apr 11, 2001  Age: 22 y.o. MRN: [id number]  Chief Complaint  Patient presents with   Follow-up    Having emotional outburst more than usual, random    Asthma    Asthma: Uses albuterol via inhaler or nebulizer solution as needed.  Reports having to use it less than once a week.  Denies having to wake up at night due to coughing or wheezing.  Does not feel that her physical activity is hindered significantly due to her asthma.  Requesting refill on albuterol nebulized solution and nebulizer supplies.  Depression/anxiety: On Zoloft 150 mg by mouth daily.  Patient denies suicidal ideation today.  Feels that overall her depression is stable or slightly better, does feel like her emotions are still a bit labile.  Reports having been to counseling for 4 to 5 years in her youth, does not want to seek additional counseling or see psychiatry at this time.  She reports she feels she can manage her current anxiety and depression on her current treatment plan.  Obesity: Patient continues to focus on lifestyle modification aimed at weight loss.  Eczema: Requesting refill on betamethasone dipropionate that she uses as needed for her eczema.    Review of Systems  Constitutional:  Negative for fever.  Respiratory:  Negative for cough, shortness of breath and wheezing.   Cardiovascular:  Negative for chest pain.  Neurological:  Negative for headaches.  Psychiatric/Behavioral:  Positive for depression. Negative for suicidal ideas.       Objective:     BP 106/66   Pulse 100   Temp 98.8 F (37.1 C) (Temporal)   Ht 5' 1.6" (1.565 m)   Wt (!) 383 lb 4 oz (173.8 kg)   SpO2 97%   BMI 71.01 kg/m  BP Readings from Last 3 Encounters:  09/09/22 106/66  07/29/22 122/68  06/24/22 (!) 148/84   Wt Readings from Last 3 Encounters:  09/09/22 (!) 383 lb 4 oz (173.8 kg)  07/29/22 (!) 394 lb 8 oz (178.9 kg)  06/24/22  (!) 391 lb (177.4 kg)         09/09/2022    8:15 AM 07/29/2022    9:10 AM 04/01/2022    4:10 PM  PHQ9 SCORE ONLY  PHQ-9 Total Score '4 12 2      '$ 07/29/2022    9:11 AM 01/08/2022   10:24 AM 12/04/2021    1:25 PM  GAD 7 : Generalized Anxiety Score  Nervous, Anxious, on Edge '1 3 3  '$ Control/stop worrying '1 2 2  '$ Worry too much - different things 0 2 3  Trouble relaxing 0 1 2  Restless 0 0 1  Easily annoyed or irritable '2 2 3  '$ Afraid - awful might happen 0 1 2  Total GAD 7 Score '4 11 16  '$ Anxiety Difficulty Somewhat difficult        Physical Exam Vitals reviewed.  Constitutional:      General: She is not in acute distress.    Appearance: Normal appearance.  HENT:     Head: Normocephalic and atraumatic.  Neck:     Vascular: No carotid bruit.  Cardiovascular:     Rate and Rhythm: Normal rate and regular rhythm.     Pulses: Normal pulses.     Heart sounds: Normal heart sounds.  Pulmonary:     Effort: Pulmonary effort is normal.     Breath sounds: Normal breath  sounds.  Skin:    General: Skin is warm and dry.  Neurological:     General: No focal deficit present.     Mental Status: She is alert and oriented to person, place, and time.  Psychiatric:        Mood and Affect: Mood normal.        Behavior: Behavior normal.        Judgment: Judgment normal.      No results found for any visits on 09/09/22.    The ASCVD Risk score (Arnett DK, et al., 2019) failed to calculate for the following reasons:   The 2019 ASCVD risk score is only valid for ages 10 to 60    Assessment & Plan:   Problem List Items Addressed This Visit       Respiratory   Mild intermittent asthma without complication    Chronic, patient reports that is well-controlled on as needed albuterol.  Nebulizer solution refilled to patient's pharmacy she was also given prescription for additional nebulizer machine supplies.  Patient educated to use albuterol inhaler 5 to 10 minutes before exercising if  needed.  Patient reports understanding.      Relevant Medications   albuterol (VENTOLIN HFA) 108 (90 Base) MCG/ACT inhaler   albuterol (PROVENTIL) (2.5 MG/3ML) 0.083% nebulizer solution   Other Relevant Orders   For home use only DME Other see comment     Musculoskeletal and Integument   Eczema    Chronic, refill of betamethasone dipropionate sent to patient's pharmacy today.  Patient will continue to use as needed for eczema.      Relevant Medications   betamethasone dipropionate 0.05 % cream     Other   Obesity - Primary    Chronic, patient has lost approximately 11 pounds since last office visit which is great.  Continue lifestyle modification.      Relevant Medications   metFORMIN (GLUCOPHAGE) 500 MG tablet   Anxiety and depression    Chronic, PHQ-9 score significantly improved compared to score in January.  Was 71, is now 4.  Patient continues to deny suicidal ideation, for now per patient preference will not refer to psychiatry or counseling services.  Patient educated to let me know if mood worsens at which point we can do referral.  Patient reports understanding.      Relevant Medications   sertraline (ZOLOFT) 50 MG tablet   Prediabetes    Chronic, patient to continue on metformin.  Refill sent to patient's pharmacy.      Relevant Medications   metFORMIN (GLUCOPHAGE) 500 MG tablet    Return in about 3 months (around 12/10/2022) for 3-6 months f/u with Judson Roch.    Ailene Ards, NP

## 2022-09-09 NOTE — Assessment & Plan Note (Signed)
Chronic, refill of betamethasone dipropionate sent to patient's pharmacy today.  Patient will continue to use as needed for eczema.

## 2022-09-09 NOTE — Assessment & Plan Note (Signed)
>>  ASSESSMENT AND PLAN FOR PREDIABETES WRITTEN ON 09/09/2022  8:39 AM BY Adella Agee E, NP  Chronic, patient to continue on metformin .  Refill sent to patient's pharmacy.

## 2022-09-09 NOTE — Patient Instructions (Addendum)
Come fasting at your next appointment for labs.

## 2022-09-09 NOTE — Assessment & Plan Note (Signed)
Chronic, patient reports that is well-controlled on as needed albuterol.  Nebulizer solution refilled to patient's pharmacy she was also given prescription for additional nebulizer machine supplies.  Patient educated to use albuterol inhaler 5 to 10 minutes before exercising if needed.  Patient reports understanding.

## 2022-09-09 NOTE — Assessment & Plan Note (Signed)
Chronic, PHQ-9 score significantly improved compared to score in January.  Was 45, is now 4.  Patient continues to deny suicidal ideation, for now per patient preference will not refer to psychiatry or counseling services.  Patient educated to let me know if mood worsens at which point we can do referral.  Patient reports understanding.

## 2022-09-22 ENCOUNTER — Telehealth: Payer: Self-pay

## 2022-09-22 NOTE — Telephone Encounter (Signed)
Pt PA for Betamethasone Dipropionate send to plan  Key: [id number]

## 2022-10-05 NOTE — Telephone Encounter (Signed)
Pharmacy Patient Advocate Encounter  Received notification from [place] [employer] that the request for prior authorization for Betamethesone Dipropionate 0.05% cream has been denied due to : because we did not see certain details about your use and treatment. We see that this request is for a drug called betamethasone dipropionate 0.05% cream for your use (dermatitis). We may consider approval of this drug after a trial of certain other drugs first (a trial and failure of two formulary preferred drugs, such as betamethasone valerate cream/ointment; fluocinonide ointment/solution; triamcinolone  acetonide cream/ lotion/ointment). We did not see records that you tried and did not respond well to two of these drugs first or that you cannot use them for certain reasons (such as a drugdrug interaction or adverse drug experience). We based this decision on your health plan's  prior authorization criteria named Preferred Drug List..    Please be advised we currently do not have a Pharmacist to review denials, therefore you will need to process appeals accordingly as needed. Thanks for your support at this time.   You may call [phone] to appeal.   Denial letter attached to chart

## 2022-10-07 ENCOUNTER — Other Ambulatory Visit: Payer: Self-pay | Admitting: Nurse Practitioner

## 2022-10-07 DIAGNOSIS — L309 Dermatitis, unspecified: Secondary | ICD-10-CM

## 2022-10-07 MED ORDER — BETAMETHASONE VALERATE 0.1 % EX OINT: 1.0000 | TOPICAL_OINTMENT | Freq: Two times a day (BID) | CUTANEOUS | 0 refills | Status: DC

## 2022-10-07 NOTE — Telephone Encounter (Signed)
Called pt inform Pamela Bowman sent in ointment instead.Marland KitchenJohny Bowman

## 2022-10-07 NOTE — Progress Notes (Signed)
I have sent in betamethasone valerate per her insurance formulary. She can apply twice a day to eczema flares on skin for up to 14 consecutive days as needed.

## 2022-12-29 NOTE — Progress Notes (Deleted)
PATIENT: Pamela Bowman DOB: [birth year]  REASON FOR VISIT: Follow up HISTORY FROM: Patient PRIMARY NEUROLOGIST: Dr. Terrace Arabia   HISTORY  Pamela Bowman 22 years old female, seen in refer by her primary care physician Dr. 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 Initial evaluation was on Aug 29 2017.  She is accompanied by her grandmother   During her most recent yearly checkup by Dr. Donald Prose on August 20, 2017, there was noted bilateral disc margin is indistinct, this is crowded, she denies significant headaches, no loss of vision, but had transient blurry vision occasionally.  She is referred here for possible pseudotumor cerebri, she does have a history of obesity, but denies significant weight change over the past few weeks.   She noted left upper and lower eyelid swelling since August 26, 2017 gradually getting worse, to the point of walking left vision sometimes,   She was given 2 rounds of double strength Bactrim since August 04, 2017 for cellulitis    Lab: A1C 5.5, BMP, CBC,   MRI of the orbit without contrast showed no significant abnormality in March 2019 Left eyelid cellulitis is much improved with antibiotic treatment, She has 2-3 headaches each week, lateralized, pounding headache with associated light noise sensitivity, she has been taking frequent BC powders, she has been absent from school for many days, complains of blurred vision, especially with sudden positional change.   Lumbar puncture on September 29, 2017 showed opening pressure of 30 cm water,   Normal CSF, WBC of 4, RBC of 0, protein of 32,   Her headache overall has  much improved, She is tolerating Topamax 100 mg twice a day, complains of numbness, tingly,  loss of appetite, now she has headache 2-3 times each week, Maxalt 5 mg as needed initially works well, now is not as effective   Reviewed feedback from ophthalmologist Dr. 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 Dione Booze recently, most recent visit in July 2021, there was no significant abnormality noted   She denies visual change, migraine has improved, about twice a week, responding well to Maxalt  Update June 24, 2021 SS: Here today alone, has gained 5 lbs, now weighs 187 lbs. She thought she lost weight, going up steps, cooking at home, stopping drink soda. Still on topamax 200 mg at bedtime. Seeing Dr. Dione Booze in March. Saw him twice last year, end of year in 2021 December. Has drivers license now. Migraines are essentially resolved, maybe 1/month, will take extra strength Tylenol, it helps. Maxalt makes her feel tingly, anxious. No vision disturbances, needs to get new frames. Needs new PCP.   Update June 24, 2022 SS: had started weight loss program for a few weeks, lost follow-up, had lost 20 lbs. Went to see Dr. Dione Booze this year, told everything stable, notes not available to me. Weight is 391 today, BMI 72. Still on topamax 200 mg daily, but rarely takes, denies any headaches.  Vision is fine.   Update December 30, 2022 SS:   REVIEW OF SYSTEMS: Out of a complete 14 system review of symptoms, the patient complains only of the following symptoms, and all other reviewed systems are  negative.   See HPI  ALLERGIES: Allergies  Allergen Reactions   Grass Pollen([id number]-O-[id number]-T-Swt Vern)    Pollen Extract     HOME MEDICATIONS: Outpatient Medications Prior to Visit  Medication Sig Dispense Refill   Acetaminophen (TYLENOL PO) Take by mouth as needed.     albuterol (PROVENTIL) (2.5 MG/3ML) 0.083% nebulizer solution Take 3 mLs (2.5 mg total) by nebulization every 6 (six) hours as  needed for wheezing or shortness of breath. 150 mL 1   albuterol (VENTOLIN HFA) 108 (90 Base) MCG/ACT inhaler Inhale 1-2 puffs into the lungs every 6 (six) hours as needed for wheezing or shortness of breath. 1 each 6   betamethasone valerate ointment (VALISONE) 0.1 % Apply 1 Application topically 2 (two) times daily. 30 g 0   cetirizine (ZYRTEC) 10 MG tablet Take 10 mg by mouth daily.     Crisaborole (EUCRISA) 2 % OINT Apply 1 Application topically 2 (two) times daily as needed (mild rash). 60 g 5   metFORMIN (GLUCOPHAGE) 500 MG tablet 1/2 po with lunch daily and 1/2 tab with dinner 90 tablet 3   sertraline (ZOLOFT) 100 MG tablet Take 1 tablet (100 mg total) by mouth daily. 90 tablet 1   sertraline (ZOLOFT) 50 MG tablet Take one 100mg  tablet and one 50mg  tablet for a total of 150mg  by mouth once a day 90 tablet 1   topiramate (TOPAMAX) 100 MG tablet Take 2 tablets (200 mg total) by mouth at bedtime. 180 tablet 4   triamcinolone ointment (KENALOG) 0.1 % Apply 1 Application topically 2 (two) times daily as needed (rash flare). Do not use on the face, neck, armpits or groin area. Do not use more than 3 weeks in a row. 30 g 0   No facility-administered medications prior to visit.    PAST MEDICAL HISTORY: Past Medical History:  Diagnosis Date   Anxiety    Asthma    Depression    Diabetes mellitus without complication (HCC)    says it is pre-diabetes   Eczema    Ingrown toenail    Morbid childhood obesity with BMI greater than 99th percentile for age [facility]) 11/19/2015   Optic nerve edema    Prediabetes    Seasonal allergies     PAST SURGICAL HISTORY: Past Surgical History:  Procedure Laterality Date   TONSILLECTOMY AND ADENOIDECTOMY Bilateral 10/15/2015   Procedure: TONSILLECTOMY AND ADENOIDECTOMY;  Surgeon: Newman Pies, MD;  Location: [facility] OR;  Service: ENT;  Laterality: Bilateral;   WISDOM TOOTH EXTRACTION      FAMILY HISTORY: Family History  Problem Relation Age of Onset   Depression  Mother    Anxiety disorder Mother    Obesity Mother    Breast cancer Maternal Grandmother    Obesity Maternal Grandmother    Obesity Paternal Grandmother    Cancer Paternal Grandfather        unsure of type   Asthma Paternal Aunt    Diabetes Other    Hypertension Other     SOCIAL HISTORY: Social History   Socioeconomic History   Marital status: Single    Spouse name: Not on file   Number of children: 0   Years of education: high school   Highest education level: Not on file  Occupational History   Occupation: Student  Tobacco Use   Smoking status: Never    Passive exposure: Current   Smokeless tobacco: Never  Vaping Use   Vaping Use: Never used  Substance and Sexual Activity   Alcohol  use: No   Drug use: No   Sexual activity: Not on file  Other Topics Concern   Not on file  Social History Narrative   12th grade at [employer].   Right-handed.   Lives at home with father and grandmother.   Some day use of caffeine.   Social Determinants of Health   Financial Resource Strain: Not on file  Food Insecurity: Not on file  Transportation Needs: Not on file  Physical Activity: Not on file  Stress: Not on file  Social Connections: Not on file  Intimate Partner Violence: Not on file   PHYSICAL EXAM  There were no vitals filed for this visit.   There is no height or weight on file to calculate BMI.  Generalized: Well developed, in no acute distress  Neurological examination  Mentation: Alert oriented to time, place, history taking. Follows all commands speech and language fluent Cranial nerve II-XII: Pupils were equal round reactive to light. Extraocular movements were full, visual field were full on confrontational test. Facial sensation and strength were normal.  Head turning and shoulder shrug  were normal and symmetric. Motor: The motor testing reveals 5 over 5 strength of all 4 extremities. Good symmetric motor tone is noted throughout.  Sensory:  Sensory testing is intact to soft touch on all 4 extremities. No evidence of extinction is noted.  Coordination: Cerebellar testing reveals good finger-nose-finger and heel-to-shin bilaterally.  Gait and station: Gait is normal, but limited by large body habitus   DIAGNOSTIC DATA (LABS, IMAGING, TESTING) - I reviewed patient records, labs, notes, testing and imaging myself where available.  Lab Results  Component Value Date   WBC 6.1 12/04/2021   HGB 13.5 12/04/2021   HCT 42.0 12/04/2021   MCV 89.7 12/04/2021   PLT 330.0 12/04/2021      Component Value Date/Time   NA 139 03/04/2022 0848   K 3.9 03/04/2022 0848   CL 106 03/04/2022 0848   CO2 23 03/04/2022 0848   GLUCOSE 94 03/04/2022 0848   BUN 15 03/04/2022 0848   CREATININE 0.87 03/04/2022 0848   CREATININE 0.95 08/09/2017 0000   CALCIUM 9.2 03/04/2022 0848   PROT 7.8 12/04/2021 1410   ALBUMIN 4.4 12/04/2021 1410   AST 32 12/04/2021 1410   ALT 27 12/04/2021 1410   ALKPHOS 46 12/04/2021 1410   BILITOT 0.7 12/04/2021 1410   GFRNONAA NOT CALCULATED 10/15/2015 0843   GFRAA NOT CALCULATED 10/15/2015 0843   Lab Results  Component Value Date   CHOL 200 12/04/2021   HDL 36.20 (L) 12/04/2021   LDLCALC 149 (H) 12/04/2021   TRIG 74.0 12/04/2021   CHOLHDL 6 12/04/2021   Lab Results  Component Value Date   HGBA1C 5.8 12/04/2021   No results found for: "VITAMINB12" Lab Results  Component Value Date   TSH 3.41 12/04/2021    ASSESSMENT AND PLAN 22 y.o. year old female  has a past medical history of Anxiety, Asthma, Depression, Diabetes mellitus without complication (HCC), Eczema, Ingrown toenail, Morbid childhood obesity with BMI greater than 99th percentile for age [facility]) (11/19/2015), Optic nerve edema, Prediabetes, and Seasonal allergies. here with:  1.  Morbid obesity, BMI 72 2.  Pseudotumor cerebri 3.  Chronic migraine headache  -Doing well, symptoms under excellent control -I will get the recent office visit from  Dr. Dione Booze to review -Would recommend taking Topamax 200 mg at bedtime, could also benefit weight loss -Discussed the importance of weight loss for long-term management of this condition -  Continue close follow-up with PCP, I will see her back in 6 months or sooner if needed  Otila Kluver, DNP 12/29/2022, 3:32 PM [facility] [address] [place], [place] [address] [phone]

## 2022-12-30 ENCOUNTER — Ambulatory Visit: Payer: Medicaid Other | Admitting: Neurology

## 2022-12-30 ENCOUNTER — Encounter: Payer: Self-pay | Admitting: Neurology

## 2023-01-13 ENCOUNTER — Ambulatory Visit: Payer: Medicaid Other | Admitting: Nurse Practitioner

## 2023-05-12 ENCOUNTER — Ambulatory Visit: Payer: Medicaid Other | Admitting: Nurse Practitioner

## 2023-06-23 ENCOUNTER — Ambulatory Visit: Payer: Medicaid Other | Admitting: Nurse Practitioner

## 2023-06-30 ENCOUNTER — Ambulatory Visit: Payer: Medicaid Other | Admitting: Nurse Practitioner

## 2023-10-11 ENCOUNTER — Encounter (INDEPENDENT_AMBULATORY_CARE_PROVIDER_SITE_OTHER): Payer: Self-pay

## 2023-10-24 ENCOUNTER — Encounter (INDEPENDENT_AMBULATORY_CARE_PROVIDER_SITE_OTHER): Payer: Self-pay

## 2023-11-29 ENCOUNTER — Ambulatory Visit: Payer: Self-pay

## 2023-11-29 NOTE — Telephone Encounter (Signed)
  Chief Complaint: back pain Symptoms: pain in back after standing for about 5-6 minutes Frequency: ongoing for years, worse recently Pertinent Negatives: Patient denies GU s/s, injury,  Disposition: [] ED /[] Urgent Care (no appt availability in office) / [x] Appointment(In office/virtual)/ []  [place] [facility]/ [] Home Care/ [] Refused Recommended Disposition /[] [place] [facility]/ []  Follow-up with PCP Additional Notes: Pt states that she cannot stand for long periods of time, usually after about 5-6 minutes pt states that her back hurts and she develops pain that radiates down the leg. Pt denies GU s/s. Denies loss of bladder/bowel control. Pt requested to see only her PCP. Copied from CRM [id number]. Topic: Clinical - Red Word Triage >> Nov 29, 2023  8:08 AM Oddis Bench wrote: Red Word that prompted transfer to Nurse Triage: Patient is calling about having issue with back and leg pain, she is not able to stand  up for longer than . Reason for Disposition  [1] Pain radiates into the thigh or further down the leg AND [2] one leg  Answer Assessment - Initial Assessment Questions 1. ONSET: "When did the pain begin?"      Ongoing for years, worse in the last year 2. LOCATION: "Where does it hurt?" (upper, mid or lower back)     lower 3. SEVERITY: "How bad is the pain?"  (e.g., Scale 1-10; mild, moderate, or severe)   - MILD (1-3): Doesn't interfere with normal activities.    - MODERATE (4-7): Interferes with normal activities or awakens from sleep.    - SEVERE (8-10): Excruciating pain, unable to do any normal activities.      10 4. PATTERN: "Is the pain constant?" (e.g., yes, no; constant, intermittent)      Every time I am standing for more than 5 min 5. RADIATION: "Does the pain shoot into your legs or somewhere else?"     Both legs 6. CAUSE:  "What do you think is causing the back pain?"      unsure 7. BACK OVERUSE:  "Any recent lifting of heavy objects, strenuous work or  exercise?"     Cannot stand for long periods of time 8. MEDICINES: "What have you taken so far for the pain?" (e.g., nothing, acetaminophen, NSAIDS)     Nothing helps 9. NEUROLOGIC SYMPTOMS: "Do you have any weakness, numbness, or problems with bowel/bladder control?"     denies 10. OTHER SYMPTOMS: "Do you have any other symptoms?" (e.g., fever, abdomen pain, burning with urination, blood in urine)       denies 11. PREGNANCY: "Is there any chance you are pregnant?" "When was your last menstrual period?"       denies  Protocols used: Back Pain-A-AH

## 2023-12-02 ENCOUNTER — Ambulatory Visit: Payer: Self-pay | Admitting: *Deleted

## 2023-12-02 NOTE — Telephone Encounter (Signed)
 Copied from CRM [id number]. Topic: Clinical - Red Word Triage >> Dec 02, 2023  8:27 AM [place] A wrote: [facility] that prompted transfer to Nurse Triage: Back Pain that has been dailyTraiged Reason for Disposition  Back pain is a chronic symptom (recurrent or ongoing AND present > 4 weeks)  Answer Assessment - Initial Assessment Questions 1. ONSET: "When did the pain begin?"      I'm having back pain.  Pt was triaged on 11/29/2023.    Just needs to reschedule because the provider is not going to be in.   No triage needed. No triage done as a result. 2. LOCATION: "Where does it hurt?" (upper, mid or lower back)     See pror triage note from 11/29/2023 3. SEVERITY: "How bad is the pain?"  (e.g., Scale 1-10; mild, moderate, or severe)   - MILD (1-3): Doesn't interfere with normal activities.    - MODERATE (4-7): Interferes with normal activities or awakens from sleep.    - SEVERE (8-10): Excruciating pain, unable to do any normal activities.       4. PATTERN: "Is the pain constant?" (e.g., yes, no; constant, intermittent)       5. RADIATION: "Does the pain shoot into your legs or somewhere else?"      6. CAUSE:  "What do you think is causing the back pain?"       7. BACK OVERUSE:  "Any recent lifting of heavy objects, strenuous work or exercise?"      8. MEDICINES: "What have you taken so far for the pain?" (e.g., nothing, acetaminophen, NSAIDS)      9. NEUROLOGIC SYMPTOMS: "Do you have any weakness, numbness, or problems with bowel/bladder control?"      10. OTHER SYMPTOMS: "Do you have any other symptoms?" (e.g., fever, abdomen pain, burning with urination, blood in urine)        11. PREGNANCY: "Is there any chance you are pregnant?" "When was your last menstrual period?"  Protocols used: Back Pain-A-AH  Chief Complaint: Pt called in to reschedule her appt because her provider is not going to be on on the date she was scheduled for.   Pt was triaged for this c/o lower back pain on  11/29/2023 by another triage nurse and scheduled so I did not triage her again since she was just needing to reschedule. Symptoms: Chronic lower back pain that has been worse lately Frequency:  Pertinent Negatives: Patient denies  Disposition: [] ED /[] Urgent Care (no appt availability in office) / [x] Appointment(In office/virtual)/ []  [place] [facility]/ [] Home Care/ [] Refused Recommended Disposition /[] [place] [facility]/ []  Follow-up with PCP Additional Notes: Appt made with Hershel Los, NP for 12/05/2023 at 1:40 at pt's request for an afternoon appt.   Adella Agee, NP did not have an availability on Monday 12/05/2023.

## 2023-12-05 ENCOUNTER — Ambulatory Visit (INDEPENDENT_AMBULATORY_CARE_PROVIDER_SITE_OTHER)

## 2023-12-05 ENCOUNTER — Ambulatory Visit: Admitting: Family Medicine

## 2023-12-05 VITALS — BP 124/76 | HR 94 | Temp 98.5°F | Resp 18 | Ht 61.6 in | Wt 393.0 lb

## 2023-12-05 DIAGNOSIS — M5441 Lumbago with sciatica, right side: Secondary | ICD-10-CM

## 2023-12-05 DIAGNOSIS — L309 Dermatitis, unspecified: Secondary | ICD-10-CM | POA: Diagnosis not present

## 2023-12-05 DIAGNOSIS — F32A Depression, unspecified: Secondary | ICD-10-CM

## 2023-12-05 DIAGNOSIS — J4520 Mild intermittent asthma, uncomplicated: Secondary | ICD-10-CM

## 2023-12-05 DIAGNOSIS — G8929 Other chronic pain: Secondary | ICD-10-CM | POA: Diagnosis not present

## 2023-12-05 DIAGNOSIS — M5442 Lumbago with sciatica, left side: Secondary | ICD-10-CM

## 2023-12-05 DIAGNOSIS — F419 Anxiety disorder, unspecified: Secondary | ICD-10-CM

## 2023-12-05 DIAGNOSIS — E66813 Obesity, class 3: Secondary | ICD-10-CM | POA: Diagnosis not present

## 2023-12-05 DIAGNOSIS — Z6841 Body Mass Index (BMI) 40.0 and over, adult: Secondary | ICD-10-CM

## 2023-12-05 MED ORDER — ZEPBOUND 2.5 MG/0.5ML ~~LOC~~ SOAJ: 2.5000 mg | SUBCUTANEOUS | 0 refills | Status: DC

## 2023-12-05 MED ORDER — EUCRISA 2 % EX OINT: 1.0000 | TOPICAL_OINTMENT | Freq: Two times a day (BID) | CUTANEOUS | 5 refills | Status: AC | PRN

## 2023-12-05 MED ORDER — SERTRALINE HCL 100 MG PO TABS: 100.0000 mg | ORAL_TABLET | Freq: Every day | ORAL | 1 refills | Status: AC

## 2023-12-05 MED ORDER — BETAMETHASONE VALERATE 0.1 % EX OINT: 1.0000 | TOPICAL_OINTMENT | Freq: Two times a day (BID) | CUTANEOUS | 2 refills | Status: AC

## 2023-12-05 MED ORDER — VENTOLIN HFA 108 (90 BASE) MCG/ACT IN AERS: 1.0000 | INHALATION_SPRAY | Freq: Four times a day (QID) | RESPIRATORY_TRACT | 0 refills | Status: AC | PRN

## 2023-12-05 MED ORDER — ALBUTEROL SULFATE (2.5 MG/3ML) 0.083% IN NEBU: 2.5000 mg | INHALATION_SOLUTION | Freq: Four times a day (QID) | RESPIRATORY_TRACT | 0 refills | Status: AC | PRN

## 2023-12-05 MED ORDER — SERTRALINE HCL 50 MG PO TABS: 50.0000 mg | ORAL_TABLET | Freq: Every day | ORAL | 1 refills | Status: AC

## 2023-12-05 NOTE — Progress Notes (Signed)
 Assessment & Plan:   Assessment & Plan Chronic bilateral low back pain with bilateral sciatica Lumbar spine x-ray completed today to r/o structural causes of pain. Discussed weight loss. Patient has been trying to monitor what she eats and states she only eats 1-2 times per day. Exercise is very limited by back pain.   Class 3 severe obesity due to excess calories with serious comorbidity and body mass index (BMI) greater than or equal to 70 in adult Encouraged low carb high protein diet. Protein in foods education provided. Started patient on Zepbound 2.5 mg weekly.   Mild intermittent asthma without complication Well controlled on current regimen of PRN use of Albuterol . Refills sent.   Eczema, unspecified type Well controlled on current regimen of Eucrisa  and Betamethasone . Refills sent.   Anxiety and depression Well controlled on current regimen of Zoloft  150 mg daily. Refills sent.   Return in about 4 weeks (around 01/02/2024) for weight w. PCP or me. Labs at that time.  Hershel Los, MSN, APRN, FNP-C  Subjective:     HPI: Pamela Bowman is a 23 y.o. female presenting on 12/05/2023 for Back Pain (Constant lower back pain over the past 7 years but has recently worsened. Improves when sitting down. ) and Leg Pain (Constant bilateral leg pain over the past 7 years but has recently worsened. No swelling. Improves when sitting down. ) . Asthma: no maintenance inhaler. Uses Albuterol  rarely in emergency situations. Last use last month.   Eczema of hand: controlled with Eucrisa  and betamethasone .   Depression/Anxiety: patient feels she is going well on Zoloft  150 mg daily.      12/05/2023    2:00 PM 09/09/2022    8:15 AM 07/29/2022    9:10 AM  Depression screen PHQ 2/9  Decreased Interest 0 1 2  Down, Depressed, Hopeless 0 0 2  PHQ - 2 Score 0 1 4  Altered sleeping 0 1 2  Tired, decreased energy 0 1 2  Change in appetite 1 1 3   Feeling bad or failure about yourself  0 0 1   Trouble concentrating 0 0 0  Moving slowly or fidgety/restless 0 0 0  Suicidal thoughts 0 0 0  PHQ-9 Score 1 4 12   Difficult doing work/chores Not difficult at all Not difficult at all Somewhat difficult     New complaints: Patient reports chronic low back pain that has worsened recently. The pain started initially when she had a lumbar puncture. She describes the pain as tight and aching; rates pain 13/10. Back pain occurs after ~5 minutes of standing. When she tries to push herself to stand longer, it starts shooting down both legs. This happens after ~2 minutes. Squatting and changing which leg a majority of weight is distributed on does not help. After 2-3 minutes of sitting the pain starts to subside, but will return in the same fashion as above when she stands back up. 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 She states she was told it was a standing job and that she needed to sign a waiver to continue utilizing the chair. Since she was unwilling to do this, she had to quit her job. She has been seen previously about her pain and has always been told it is due to her weight. No previous imaging.     Social history:  Relevant past medical, surgical, family and social history reviewed  and updated as indicated. Interim medical history since our last visit reviewed.  Allergies and medications reviewed and updated.  DATA REVIEWED: CHART IN EPIC  ROS: Negative unless specifically indicated above in HPI.    Current Outpatient Medications:    Acetaminophen (TYLENOL PO), Take by mouth as needed., Disp: , Rfl:    albuterol  (VENTOLIN  HFA) 108 (90 Base) MCG/ACT inhaler, Inhale 1-2 puffs into the lungs every 6 (six) hours as needed for wheezing or shortness of breath., Disp: 18 g, Rfl: 0   cetirizine (ZYRTEC) 10 MG tablet, Take 10 mg by mouth daily., Disp: , Rfl:    metFORMIN  (GLUCOPHAGE ) 500 MG tablet, 1/2 po with  lunch daily and 1/2 tab with dinner, Disp: 90 tablet, Rfl: 3   tirzepatide (ZEPBOUND) 2.5 MG/0.5ML Pen, Inject 2.5 mg into the skin once a week., Disp: 2 mL, Rfl: 0   albuterol  (PROVENTIL ) (2.5 MG/3ML) 0.083% nebulizer solution, Take 3 mLs (2.5 mg total) by nebulization every 6 (six) hours as needed for wheezing or shortness of breath., Disp: 150 mL, Rfl: 0   betamethasone  valerate ointment (VALISONE ) 0.1 %, Apply 1 Application topically 2 (two) times daily., Disp: 45 g, Rfl: 2   Crisaborole  (EUCRISA ) 2 % OINT, Apply 1 Application topically 2 (two) times daily as needed (mild rash)., Disp: 100 g, Rfl: 5   sertraline  (ZOLOFT ) 100 MG tablet, Take 1 tablet (100 mg total) by mouth daily. Take in addition to the 50 mg tablet for a total daily dose of 150 mg., Disp: 90 tablet, Rfl: 1   sertraline  (ZOLOFT ) 50 MG tablet, Take 1 tablet (50 mg total) by mouth daily. Take in addition to the 100 mg tablet for a total daily dose of 150 mg., Disp: 90 tablet, Rfl: 1      Objective:    BP 124/76   Pulse 94   Temp 98.5 F (36.9 C)   Resp 18   Ht 5' 1.6" (1.565 m)   Wt (!) 393 lb (178.3 kg)   LMP 11/01/2023 (Approximate)   SpO2 97%   BMI 72.82 kg/m   Wt Readings from Last 3 Encounters:  12/05/23 (!) 393 lb (178.3 kg)  09/09/22 (!) 383 lb 4 oz (173.8 kg)  07/29/22 (!) 394 lb 8 oz (178.9 kg)    Physical Exam Vitals reviewed.  Constitutional:      General: She is not in acute distress.    Appearance: Normal appearance. She is morbidly obese. She is not ill-appearing, toxic-appearing or diaphoretic.  HENT:     Head: Normocephalic and atraumatic.  Eyes:     General: No scleral icterus.       Right eye: No discharge.        Left eye: No discharge.     Conjunctiva/sclera: Conjunctivae normal.  Cardiovascular:     Rate and Rhythm: Normal rate and regular rhythm.     Heart sounds: Normal heart sounds. No murmur heard.    No friction rub. No gallop.  Pulmonary:     Effort: Pulmonary effort is  normal. No respiratory distress.     Breath sounds: Normal breath sounds. No stridor. No wheezing, rhonchi or rales.  Musculoskeletal:        General: Normal range of motion.     Cervical back: Normal range of motion.     Lumbar back: Normal. Negative right straight leg raise test and negative left straight leg raise test.  Skin:    General: Skin is warm and dry.  Capillary Refill: Capillary refill takes less than 2 seconds.  Neurological:     General: No focal deficit present.     Mental Status: She is alert and oriented to person, place, and time. Mental status is at baseline.  Psychiatric:        Mood and Affect: Mood normal.        Behavior: Behavior normal.        Thought Content: Thought content normal.        Judgment: Judgment normal.

## 2023-12-08 ENCOUNTER — Ambulatory Visit: Admitting: Nurse Practitioner

## 2023-12-08 ENCOUNTER — Telehealth: Payer: Self-pay

## 2023-12-08 ENCOUNTER — Telehealth: Payer: Self-pay | Admitting: Nurse Practitioner

## 2023-12-08 ENCOUNTER — Other Ambulatory Visit (HOSPITAL_COMMUNITY): Payer: Self-pay

## 2023-12-08 NOTE — Telephone Encounter (Signed)
 Pharmacy Patient Advocate Encounter  Received notification from [employer] that Prior Authorization for Eucrisa  2% ointment has been APPROVED from 12/08/23 to 12/07/24. Ran test claim, Copay is $4. This test claim was processed through [facility]- copay amounts may vary at other pharmacies due to pharmacy/plan contracts, or as the patient moves through the different stages of their insurance plan.   PA #/Case ID/Reference #: [id number]  Left a message at [facility] to notify of the approval    [employer] is the correct insurance

## 2023-12-08 NOTE — Telephone Encounter (Signed)
 Pharmacy Patient Advocate Encounter   Received notification from Pt Calls Messages that prior authorization for Eucrisa  2% ointment is required/requested.   Insurance verification completed.   The patient is insured through [facility] .   Per test claim: PA required; PA submitted to above mentioned insurance via CoverMyMeds Key/confirmation #/EOC [id number] Status is pending

## 2023-12-08 NOTE — Telephone Encounter (Signed)
 Copied from CRM [id number]. Topic: Clinical - Prescription Issue >> Dec 08, 2023 11:33 AM Zipporah Him wrote: Reason for CRM: tirzepatide (ZEPBOUND) 2.5 MG/0.5ML Pen,  Crisaborole  (EUCRISA ) 2 % OINT. Patient states that the pharmacy needs a prior authorization.

## 2023-12-08 NOTE — Telephone Encounter (Signed)
 Zepbound is not preferred on the medicaid list unless the patient has a diagnosis of OSA syndrome.   Can the order be changed to [facility]?  Please advise.

## 2023-12-09 ENCOUNTER — Other Ambulatory Visit: Payer: Self-pay | Admitting: Family

## 2023-12-09 MED ORDER — WEGOVY 0.25 MG/0.5ML ~~LOC~~ SOAJ
0.2500 mg | SUBCUTANEOUS | 1 refills | Status: DC
Start: 2023-12-09 — End: 2024-02-21

## 2023-12-09 NOTE — Telephone Encounter (Signed)
 Message send to covering provider

## 2023-12-09 NOTE — Telephone Encounter (Signed)
 Please disregard this medication

## 2023-12-12 ENCOUNTER — Ambulatory Visit: Payer: Self-pay | Admitting: Family Medicine

## 2023-12-12 NOTE — Telephone Encounter (Signed)
 Copied from CRM [id number]. Topic: Clinical - Prescription Issue >> Dec 12, 2023  9:50 AM Pamela Bowman R wrote: Pt is calling to check on the status of her prior auth for  tirzepatide  (ZEPBOUND ) 2.5 MG/0.5ML Pen

## 2023-12-13 ENCOUNTER — Telehealth: Payer: Self-pay

## 2023-12-13 ENCOUNTER — Other Ambulatory Visit (HOSPITAL_COMMUNITY): Payer: Self-pay

## 2023-12-13 NOTE — Telephone Encounter (Signed)
 Pharmacy Patient Advocate Encounter   Received notification from Pt Calls Messages that prior authorization for Wegovy  0.25mg /0.72ml is required/requested.   Insurance verification completed.   The patient is insured through [facility] .   Per test claim: PA required; PA submitted to above mentioned insurance via CoverMyMeds Key/confirmation #/EOC [id number] Status is pending

## 2023-12-14 ENCOUNTER — Other Ambulatory Visit (HOSPITAL_COMMUNITY): Payer: Self-pay

## 2023-12-14 ENCOUNTER — Other Ambulatory Visit: Payer: Self-pay | Admitting: Nurse Practitioner

## 2023-12-14 NOTE — Telephone Encounter (Signed)
 Copied from CRM [id number]. Topic: Clinical - Prescription Issue >> Dec 14, 2023 11:25 AM Luane Rumps D wrote: Reason for CRM: Patient is calling because her Semaglutide -Weight Management (WEGOVY ) 0.25 MG/0.5ML SOAJ is out of stock at her current pharmacy and [facility] would [facility]  [id number], [place] - [address]

## 2023-12-14 NOTE — Telephone Encounter (Signed)
 Pharmacy Patient Advocate Encounter  Received notification from [facility] that Prior Authorization for Wegovy  0.25MG /0.5ML auto-injectors  has been APPROVED from 12/13/23 to 06/10/24. Unable to obtain price due to refill too soon rejection, last fill date 12/14/23 next available fill date07/04/25   PA #/Case ID/Reference #: [id number]

## 2024-01-02 ENCOUNTER — Ambulatory Visit: Admitting: Family Medicine

## 2024-01-16 ENCOUNTER — Other Ambulatory Visit: Payer: Self-pay | Admitting: Nurse Practitioner

## 2024-01-16 NOTE — Telephone Encounter (Unsigned)
 Copied from CRM [id number]. Topic: Clinical - Medication Refill >> Jan 16, 2024  1:32 PM Shereese L wrote: Medication: Semaglutide -Weight Management (WEGOVY ) 0.25 MG/0.5ML SOAJ  Has the patient contacted their pharmacy? Yes (Agent: If no, request that the patient contact the pharmacy for the refill. If patient does not wish to contact the pharmacy document the reason why and proceed with request.) (Agent: If yes, when and what did the pharmacy advise?)  This is the patient's preferred pharmacy:   [id number] [place], [place] - [address] [phone] [address] [place] [address]   Is this the correct pharmacy for this prescription? Yes If no, delete pharmacy and type the correct one.   Has the prescription been filled recently? Yes  Is the patient out of the medication? Yes  Has the patient been seen for an appointment in the last year OR does the patient have an upcoming appointment? Yes  Can we respond through MyChart? Yes  Agent: Please be advised that Rx refills may take up to 3 business days. We ask that you follow-up with your pharmacy.

## 2024-02-19 ENCOUNTER — Other Ambulatory Visit: Payer: Self-pay | Admitting: Family

## 2024-02-21 ENCOUNTER — Other Ambulatory Visit: Payer: Self-pay | Admitting: Nurse Practitioner

## 2024-02-21 NOTE — Telephone Encounter (Unsigned)
 Copied from CRM #[id number]. Topic: Clinical - Medication Refill >> Feb 21, 2024  9:39 AM Berneda FALCON wrote: Medication: Semaglutide -Weight Management (WEGOVY ) 0.25 MG/0.5ML SOAJ  Has the patient contacted their pharmacy? Yes (Agent: If no, request that the patient contact the pharmacy for the refill. If patient does not wish to contact the pharmacy document the reason why and proceed with request.) (Agent: If yes, when and what did the pharmacy advise?)  This is the patient's preferred pharmacy:  [id number] [place], [place] - [address]  [address] Phone: [phone] Fax: [phone]  Is this the correct pharmacy for this prescription? Yes If no, delete pharmacy and type the correct one.   Has the prescription been filled recently? No  Is the patient out of the medication? Yes  Has the patient been seen for an appointment in the last year OR does the patient have an upcoming appointment? Yes  Can we respond through MyChart? Yes  Agent: Please be advised that Rx refills may take up to 3 business days. We ask that you follow-up with your pharmacy.

## 2024-02-22 MED ORDER — WEGOVY 0.25 MG/0.5ML ~~LOC~~ SOAJ: 0.2500 mg | SUBCUTANEOUS | 1 refills | Status: AC

## 2024-03-01 ENCOUNTER — Ambulatory Visit: Admitting: Nurse Practitioner

## 2024-03-15 ENCOUNTER — Ambulatory Visit: Admitting: Nurse Practitioner

## 2024-03-23 ENCOUNTER — Ambulatory Visit: Admitting: Nurse Practitioner

## 2024-06-06 ENCOUNTER — Other Ambulatory Visit (HOSPITAL_COMMUNITY): Payer: Self-pay

## 2024-06-08 ENCOUNTER — Ambulatory Visit: Admitting: Nurse Practitioner

## 2024-06-12 ENCOUNTER — Telehealth: Payer: Self-pay

## 2024-06-12 ENCOUNTER — Other Ambulatory Visit (HOSPITAL_COMMUNITY): Payer: Self-pay

## 2024-06-12 NOTE — Telephone Encounter (Signed)
 Pharmacy Patient Advocate Encounter   Received notification from Onbase that prior authorization for Wegovy  0.25MG /0.5ML auto-injectors is required/requested.   Insurance verification completed.   The patient is insured through [employer].   Per test claim: Effective October 1st, Medicaid discontinued coverage of GLP1 medications for weight loss (such as Wegovy  and Zepbound), unless the patient has a documented history of a heart attack or stroke. Zepbound will continue to be covered only for patients with moderate to severe sleep apnea (AHI 15-30) and a BMI greater than 40. Because of this change, the prior authorization team will not be submitting new PA requests for GLP1 medications prescribed for weight loss, as patients will be unable to continue therapy under Medicaid coverage.

## 2024-06-21 ENCOUNTER — Ambulatory Visit: Admitting: Nurse Practitioner

## 2024-08-02 ENCOUNTER — Ambulatory Visit: Admitting: Nurse Practitioner

## 2024-08-17 ENCOUNTER — Ambulatory Visit: Admitting: Nurse Practitioner
